# Patient Record
Sex: Female | Born: 1965 | Race: White | Hispanic: No | Marital: Married | Smoking: Never smoker
Health system: Southern US, Community
[De-identification: ages and names within clinical notes are randomized; demographics above are authoritative.]

## PROBLEM LIST (undated history)

## (undated) DIAGNOSIS — K5732 Diverticulitis of large intestine without perforation or abscess without bleeding: Secondary | ICD-10-CM

## (undated) DIAGNOSIS — E119 Type 2 diabetes mellitus without complications: Secondary | ICD-10-CM

## (undated) DIAGNOSIS — F419 Anxiety disorder, unspecified: Secondary | ICD-10-CM

## (undated) DIAGNOSIS — K219 Gastro-esophageal reflux disease without esophagitis: Secondary | ICD-10-CM

## (undated) DIAGNOSIS — K760 Fatty (change of) liver, not elsewhere classified: Principal | ICD-10-CM

## (undated) DIAGNOSIS — M1990 Unspecified osteoarthritis, unspecified site: Secondary | ICD-10-CM

## (undated) DIAGNOSIS — IMO0001 Reserved for inherently not codable concepts without codable children: Secondary | ICD-10-CM

## (undated) DIAGNOSIS — R011 Cardiac murmur, unspecified: Secondary | ICD-10-CM

## (undated) DIAGNOSIS — R600 Localized edema: Secondary | ICD-10-CM

## (undated) HISTORY — DX: Cardiac murmur, unspecified: R01.1

## (undated) HISTORY — DX: Fatty (change of) liver, not elsewhere classified: K76.0

## (undated) HISTORY — DX: Localized edema: R60.0

## (undated) HISTORY — DX: Morbid (severe) obesity due to excess calories: E66.01

## (undated) HISTORY — DX: Type 2 diabetes mellitus without complications: E11.9

## (undated) HISTORY — DX: Reserved for inherently not codable concepts without codable children: IMO0001

## (undated) HISTORY — DX: Anxiety disorder, unspecified: F41.9

## (undated) HISTORY — DX: Diverticulitis of large intestine without perforation or abscess without bleeding: K57.32

---

## 1991-09-27 HISTORY — PX: TONSILLECTOMY: SUR1361

## 1993-06-27 HISTORY — PX: BARTHOLIN GLAND CYST EXCISION: SHX565

## 2001-05-15 HISTORY — PX: LYSIS OF ADHESION: SHX5961

## 2001-05-15 HISTORY — PX: LAPAROSCOPIC OOPHERECTOMY: SHX6507

## 2001-05-15 HISTORY — PX: SALPINGECTOMY: SHX328

## 2001-12-18 HISTORY — PX: ABDOMINAL HYSTERECTOMY: SHX81

## 2004-01-01 HISTORY — PX: CHOLECYSTECTOMY: SHX55

## 2004-06-10 ENCOUNTER — Ambulatory Visit: Payer: Self-pay | Admitting: Obstetrics and Gynecology

## 2005-08-17 ENCOUNTER — Ambulatory Visit: Payer: Self-pay | Admitting: Obstetrics and Gynecology

## 2006-03-02 ENCOUNTER — Ambulatory Visit: Payer: Self-pay | Admitting: Internal Medicine

## 2006-03-22 ENCOUNTER — Ambulatory Visit: Payer: Self-pay | Admitting: Unknown Physician Specialty

## 2006-05-05 ENCOUNTER — Ambulatory Visit: Payer: Self-pay | Admitting: Otolaryngology

## 2006-07-27 ENCOUNTER — Ambulatory Visit: Payer: Self-pay | Admitting: Otolaryngology

## 2006-08-17 ENCOUNTER — Ambulatory Visit: Payer: Self-pay | Admitting: Otolaryngology

## 2006-08-17 HISTORY — PX: SEPTOSTOMY: SHX2394

## 2006-09-06 ENCOUNTER — Ambulatory Visit: Payer: Self-pay | Admitting: Internal Medicine

## 2006-11-14 ENCOUNTER — Ambulatory Visit: Payer: Self-pay | Admitting: Obstetrics and Gynecology

## 2006-12-21 ENCOUNTER — Ambulatory Visit: Payer: Self-pay | Admitting: Internal Medicine

## 2007-08-22 ENCOUNTER — Ambulatory Visit: Payer: Self-pay | Admitting: Obstetrics and Gynecology

## 2007-10-18 ENCOUNTER — Encounter: Admission: RE | Admit: 2007-10-18 | Discharge: 2007-10-18 | Payer: Self-pay | Admitting: Gastroenterology

## 2007-11-16 ENCOUNTER — Ambulatory Visit: Payer: Self-pay | Admitting: Obstetrics and Gynecology

## 2008-11-18 ENCOUNTER — Ambulatory Visit: Payer: Self-pay | Admitting: Internal Medicine

## 2010-03-03 ENCOUNTER — Ambulatory Visit: Payer: Self-pay | Admitting: Internal Medicine

## 2010-07-18 ENCOUNTER — Encounter: Payer: Self-pay | Admitting: Gastroenterology

## 2011-05-13 ENCOUNTER — Telehealth: Payer: Self-pay | Admitting: Internal Medicine

## 2011-05-13 DIAGNOSIS — Z1239 Encounter for other screening for malignant neoplasm of breast: Secondary | ICD-10-CM

## 2011-05-13 NOTE — Telephone Encounter (Signed)
Order was given to [place].  She will contact patient.

## 2011-05-13 NOTE — Telephone Encounter (Signed)
Patient called and stated it was time for her mammogram to be ordered.  She is also getting [place] to fax over her last one.

## 2011-05-13 NOTE — Telephone Encounter (Signed)
Order made , on printer.

## 2011-06-15 ENCOUNTER — Ambulatory Visit: Payer: Self-pay | Admitting: Internal Medicine

## 2012-06-05 ENCOUNTER — Ambulatory Visit: Payer: Self-pay | Admitting: Orthopedic Surgery

## 2012-06-25 ENCOUNTER — Ambulatory Visit: Payer: Self-pay | Admitting: Orthopedic Surgery

## 2012-06-25 LAB — CBC
HCT: 39.9 % (ref 35.0–47.0)
MCV: 83 fL (ref 80–100)
RBC: 4.8 10*6/uL (ref 3.80–5.20)
RDW: 13.1 % (ref 11.5–14.5)
WBC: 10.2 10*3/uL (ref 3.6–11.0)

## 2012-06-25 LAB — BASIC METABOLIC PANEL
Anion Gap: 6 — ABNORMAL LOW (ref 7–16)
Calcium, Total: 9.4 mg/dL (ref 8.5–10.1)
Co2: 30 mmol/L (ref 21–32)
EGFR (Non-African Amer.): >60
Osmolality: 285 (ref 275–301)

## 2012-06-25 LAB — PROTIME-INR: Prothrombin Time: 11.9 secs (ref 11.5–14.7)

## 2012-06-29 ENCOUNTER — Ambulatory Visit: Payer: Self-pay | Admitting: Orthopedic Surgery

## 2012-07-10 HISTORY — PX: ARTHROSCOPIC REPAIR ACL: SUR80

## 2012-08-28 ENCOUNTER — Ambulatory Visit: Payer: Self-pay | Admitting: Family Medicine

## 2012-09-24 ENCOUNTER — Ambulatory Visit: Payer: Self-pay | Admitting: Family Medicine

## 2012-10-01 ENCOUNTER — Ambulatory Visit: Payer: Self-pay | Admitting: Family Medicine

## 2012-10-25 ENCOUNTER — Ambulatory Visit: Payer: Self-pay | Admitting: Family Medicine

## 2013-05-02 ENCOUNTER — Emergency Department: Payer: Self-pay | Admitting: Emergency Medicine

## 2013-05-02 LAB — CBC
MCH: 27.9 pg (ref 26.0–34.0)
MCHC: 34.6 g/dL (ref 32.0–36.0)
RBC: 4.89 10*6/uL (ref 3.80–5.20)
RDW: 13 % (ref 11.5–14.5)
WBC: 10.4 10*3/uL (ref 3.6–11.0)

## 2013-05-02 LAB — URINALYSIS, COMPLETE
Leukocyte Esterase: NEGATIVE
Protein: NEGATIVE
Specific Gravity: 1.017 (ref 1.003–1.030)
Squamous Epithelial: 3

## 2013-05-02 LAB — COMPREHENSIVE METABOLIC PANEL
Albumin: 3.5 g/dL (ref 3.4–5.0)
Anion Gap: 3 — ABNORMAL LOW (ref 7–16)
Bilirubin,Total: 0.4 mg/dL (ref 0.2–1.0)
Calcium, Total: 9.5 mg/dL (ref 8.5–10.1)
Chloride: 103 mmol/L (ref 98–107)
Co2: 31 mmol/L (ref 21–32)
Creatinine: 0.65 mg/dL (ref 0.60–1.30)
EGFR (African American): >60
Glucose: 182 mg/dL — ABNORMAL HIGH (ref 65–99)
Potassium: 4.2 mmol/L (ref 3.5–5.1)
SGOT(AST): 28 U/L (ref 15–37)
SGPT (ALT): 26 U/L (ref 12–78)
Sodium: 137 mmol/L (ref 136–145)
Total Protein: 7.4 g/dL (ref 6.4–8.2)

## 2013-05-02 LAB — LIPASE, BLOOD: Lipase: 110 U/L (ref 73–393)

## 2014-07-28 ENCOUNTER — Emergency Department: Payer: Self-pay | Admitting: Emergency Medicine

## 2014-07-28 LAB — CBC WITH DIFFERENTIAL/PLATELET
BASOS ABS: 0 10*3/uL (ref 0.0–0.1)
Basophil %: 0.4 %
EOS ABS: 0.1 10*3/uL (ref 0.0–0.7)
Eosinophil %: 1 %
HCT: 45.8 % (ref 35.0–47.0)
HGB: 15.1 g/dL (ref 12.0–16.0)
LYMPHS PCT: 22.5 %
Lymphocyte #: 1.6 10*3/uL (ref 1.0–3.6)
MCH: 26.6 pg (ref 26.0–34.0)
MCHC: 33.1 g/dL (ref 32.0–36.0)
MCV: 80 fL (ref 80–100)
Monocyte #: 0.4 x10 3/mm (ref 0.2–0.9)
Monocyte %: 5.8 %
Neutrophil #: 4.9 10*3/uL (ref 1.4–6.5)
Neutrophil %: 70.3 %
PLATELETS: 263 10*3/uL (ref 150–440)
RBC: 5.7 10*6/uL — ABNORMAL HIGH (ref 3.80–5.20)
RDW: 12.9 % (ref 11.5–14.5)
WBC: 7 10*3/uL (ref 3.6–11.0)

## 2014-07-28 LAB — COMPREHENSIVE METABOLIC PANEL
ALBUMIN: 3.3 g/dL — AB (ref 3.4–5.0)
ALT: 76 U/L — AB (ref 14–63)
AST: 67 U/L — AB (ref 15–37)
Alkaline Phosphatase: 104 U/L (ref 46–116)
Anion Gap: 10 (ref 7–16)
BUN: 11 mg/dL (ref 7–18)
Bilirubin,Total: 0.4 mg/dL (ref 0.2–1.0)
CALCIUM: 9.3 mg/dL (ref 8.5–10.1)
Chloride: 102 mmol/L (ref 98–107)
Co2: 24 mmol/L (ref 21–32)
Creatinine: 0.79 mg/dL (ref 0.60–1.30)
EGFR (African American): >60
EGFR (Non-African Amer.): >60
Glucose: 205 mg/dL — ABNORMAL HIGH (ref 65–99)
Osmolality: 277 (ref 275–301)
POTASSIUM: 3 mmol/L — AB (ref 3.5–5.1)
Sodium: 136 mmol/L (ref 136–145)
TOTAL PROTEIN: 7.5 g/dL (ref 6.4–8.2)

## 2014-07-28 LAB — URINALYSIS, COMPLETE
BACTERIA: NONE SEEN
BLOOD: NEGATIVE
Bilirubin,UR: NEGATIVE
GLUCOSE, UR: NEGATIVE mg/dL (ref 0–75)
Hyaline Cast: 4
Nitrite: NEGATIVE
Ph: 5 (ref 4.5–8.0)
Protein: 30
RBC,UR: 1 /HPF (ref 0–5)
SPECIFIC GRAVITY: 1.02 (ref 1.003–1.030)

## 2014-07-28 LAB — CLOSTRIDIUM DIFFICILE(ARMC)

## 2014-10-08 ENCOUNTER — Ambulatory Visit
Admit: 2014-10-08 | Disposition: A | Discharge status: Home / Self Care | Payer: Self-pay | Attending: Family Medicine | Admitting: Family Medicine

## 2015-01-01 ENCOUNTER — Other Ambulatory Visit: Payer: Self-pay | Admitting: Family Medicine

## 2015-01-01 DIAGNOSIS — I872 Venous insufficiency (chronic) (peripheral): Secondary | ICD-10-CM

## 2015-01-02 ENCOUNTER — Other Ambulatory Visit: Payer: Self-pay

## 2015-01-02 DIAGNOSIS — E1165 Type 2 diabetes mellitus with hyperglycemia: Secondary | ICD-10-CM

## 2015-01-02 DIAGNOSIS — IMO0002 Reserved for concepts with insufficient information to code with codable children: Secondary | ICD-10-CM

## 2015-01-02 NOTE — Telephone Encounter (Signed)
Received a fax from [facility] requesting a refill of this patient's Invokana.  Checked old EMR and the note states:  Invokana 300MG , 1 (one) Tablet daily, #30, 30 days starting 08/26/2014, Ref. x5. Active. Associated Diagnosis: Uncontrolled type II diabetes mellitus (250.02  E11.65) Recorded 08/26/2014 08:32 AM by Bobetta Lime, MD, Office Visit. ePrescribed to [phone], 08/26/2014 8:32 AM [facility] [address] [place], [place], [place] [address] [phone]

## 2015-01-05 MED ORDER — CANAGLIFLOZIN 300 MG PO TABS: 300.0000 mg | ORAL_TABLET | Freq: Every day | ORAL | Status: DC

## 2015-01-26 ENCOUNTER — Telehealth: Payer: Self-pay | Admitting: Family Medicine

## 2015-01-26 NOTE — Telephone Encounter (Signed)
PT ASKING FOR LAB SHEET FOR LABS. HAS TO HAVE THIS WITH IN 2 WKS BEFORE SHE CAN GET ANY RX REFILLED.

## 2015-01-27 NOTE — Telephone Encounter (Signed)
Tried to contact patient to inform her that she needs to follow-up in office for her diabetes, but there was no answer. A message was left for the patient to give [place] a call so we can get her an appt.

## 2015-01-27 NOTE — Telephone Encounter (Signed)
Full panel blood work done 07/2014 she needs to follow up in office for diabetes and I will check her glucose and Hba1c in the office.

## 2015-03-17 ENCOUNTER — Ambulatory Visit (INDEPENDENT_AMBULATORY_CARE_PROVIDER_SITE_OTHER): Payer: 59 | Admitting: Family Medicine

## 2015-03-17 ENCOUNTER — Encounter: Payer: Self-pay | Admitting: Family Medicine

## 2015-03-17 VITALS — BP 136/72 | HR 90 | Temp 98.3°F | Resp 16 | Ht 67.0 in | Wt 255.3 lb

## 2015-03-17 DIAGNOSIS — E669 Obesity, unspecified: Secondary | ICD-10-CM | POA: Diagnosis not present

## 2015-03-17 DIAGNOSIS — R600 Localized edema: Secondary | ICD-10-CM | POA: Diagnosis not present

## 2015-03-17 DIAGNOSIS — IMO0002 Reserved for concepts with insufficient information to code with codable children: Secondary | ICD-10-CM

## 2015-03-17 DIAGNOSIS — E1165 Type 2 diabetes mellitus with hyperglycemia: Secondary | ICD-10-CM

## 2015-03-17 DIAGNOSIS — F411 Generalized anxiety disorder: Secondary | ICD-10-CM | POA: Diagnosis not present

## 2015-03-17 DIAGNOSIS — Z8719 Personal history of other diseases of the digestive system: Secondary | ICD-10-CM | POA: Diagnosis not present

## 2015-03-17 DIAGNOSIS — R1031 Right lower quadrant pain: Secondary | ICD-10-CM

## 2015-03-17 HISTORY — DX: Morbid (severe) obesity due to excess calories: E66.01

## 2015-03-17 LAB — POCT URINALYSIS DIPSTICK
Bilirubin, UA: NEGATIVE
Blood, UA: NEGATIVE
GLUCOSE UA: NEGATIVE
Ketones, UA: NEGATIVE
NITRITE UA: NEGATIVE
Protein, UA: NEGATIVE
Spec Grav, UA: >=1.03
UROBILINOGEN UA: 0.2
pH, UA: 5

## 2015-03-17 MED ORDER — ALPRAZOLAM 0.5 MG PO TABS: 0.5000 mg | ORAL_TABLET | Freq: Every evening | ORAL | Status: DC | PRN

## 2015-03-17 MED ORDER — HYDROCODONE-ACETAMINOPHEN 5-300 MG PO TABS: 1.0000 | ORAL_TABLET | Freq: Three times a day (TID) | ORAL | Status: DC | PRN

## 2015-03-17 NOTE — Progress Notes (Signed)
Name: Jessica Tran   MRN: [id number]    DOB: 11/16/1965   Date:03/17/2015       Progress Note  Subjective  Chief Complaint  Chief Complaint  Patient presents with  . Nephrolithiasis    patient stated that she started having some right lower quadrant pain that started on wednesday and now it is radiating to her lower back/ gluteal area.    HPI  Jessica Tran is a 49 year old female with a history of uncontrolled DMII, Obesity, history of diverticular inflammation x2 in the past year here today with acute concerns of RLQ abdominal pain. Onset four days ago on Friday suddenly. Described as alternating sharp and cramping pain in the RLQ with more recent radiation to right hip and right lower flank which is worse with movement. Denies nausea, vomiting, diarrhea, constipation, fevers, chills, change in urinary color and stream.  She works at [employer] and so a Jessica Tran there put her on Ciprofloxacin 500 mg po bid along with Metronidazole 500 mg po qid. She reports slowly improving symptoms since then but not resolved. She has a high deductible insurance plan so does not want expensive testing done if possible. She has not had a kidney stone in the past but did read that her Invokana could cause this so she stopped taking it about 3 weeks ago. Also the Invokana has made her feel funny symptoms in her chest. She notes that her sugars are still well controled, fasting ranges 90-120 and meal time is around 150s.   Active Ambulatory Problems    Diagnosis Date Noted  . History of diverticulitis 03/17/2015  . Diabetes mellitus type II, uncontrolled 03/17/2015  . Bilateral lower extremity edema 03/17/2015  . Generalized anxiety disorder 03/17/2015  . Obesity, Class II, BMI 35-39.9 03/17/2015  . Acute right lower quadrant pain 03/17/2015   Resolved Ambulatory Problems    Diagnosis Date Noted  . No Resolved Ambulatory Problems   Past Medical History  Diagnosis Date  . Anxiety   . Diabetes  mellitus without complication   . Obesity, Class II, BMI 35.0-39.9, with comorbidity (see actual BMI)   . Cardiac murmur   . Diverticulitis of colon     Social History  Substance Use Topics  . Smoking status: Never Smoker   . Smokeless tobacco: Not on file  . Alcohol Use: 0.0 oz/week    0 Standard drinks or equivalent per week     Comment: occasionally     Current outpatient prescriptions:  .  ciprofloxacin (CIPRO) 500 MG tablet, Take 500 mg by mouth 2 (two) times daily., Disp: , Rfl:  .  glipiZIDE (GLUCOTROL) 10 MG tablet, Take 10 mg by mouth 2 (two) times daily before a meal., Disp: , Rfl:  .  metroNIDAZOLE (FLAGYL) 500 MG tablet, Take 500 mg by mouth 4 (four) times daily., Disp: , Rfl:  .  ALPRAZolam (XANAX) 0.5 MG tablet, Take 1 tablet (0.5 mg total) by mouth at bedtime as needed for anxiety., Disp: 30 tablet, Rfl: 5 .  Hydrocodone-Acetaminophen 5-300 MG TABS, Take 1 tablet by mouth every 8 (eight) hours as needed., Disp: 30 each, Rfl: 0 PRESCRIBED TODAY 03/17/15  Past Surgical History  Procedure Laterality Date  . Laparoscopic oopherectomy Left 05/15/2001  . Cholecystectomy  01/01/2004    LAPROSCOPIC  . Abdominal hysterectomy  12/18/2001    LAPROSCOPIC SUPRACERVICAL WITH LYSES OF ADHESIONS  . Septostomy  08/17/2006  . Arthroscopic repair acl Right 07/10/2012    MCL TEAR 1995  .  Bartholin gland cyst excision  1995  . Salpingectomy Left 05/15/2001    LAPROSCOPIC  . Lysis of adhesion  05/15/2001    LAPARSCOPIC  . Tonsillectomy  09/27/1991    Family History  Problem Relation Age of Onset  . Hyperlipidemia Mother   . Hypertension Mother   . Diabetes Mother   . Hyperlipidemia Father   . Hypertension Father     Allergies  Allergen Reactions  . Clindamycin/Lincomycin Swelling    Throat swelling     Review of Systems  CONSTITUTIONAL: No significant weight changes, fever, chills, weakness or fatigue.   CARDIOVASCULAR: No chest pain, chest pressure or chest discomfort.  No palpitations or edema.  RESPIRATORY: No shortness of breath, cough or sputum.  GASTROINTESTINAL: No anorexia, nausea, vomiting. No changes in bowel habits. Yes RLQ abdominal pain. GENITOURINARY: No dysuria. No frequency. No discharge.  NEUROLOGICAL: No headache, dizziness, syncope, paralysis, ataxia, numbness or tingling in the extremities. No memory changes. No change in bowel or bladder control.  MUSCULOSKELETAL: No joint pain. No muscle pain. HEMATOLOGIC: No anemia, bleeding or bruising.  LYMPHATICS: No enlarged lymph nodes.  PSYCHIATRIC: No change in mood. No change in sleep pattern.  ENDOCRINOLOGIC: No reports of sweating, cold or heat intolerance. No polyuria or polydipsia.     Objective  BP 136/72 mmHg  Pulse 90  Temp(Src) 98.3 F (36.8 C) (Oral)  Resp 16  Ht 5\' 7"  (1.702 m)  Wt 255 lb 4.8 oz (115.803 kg)  BMI 39.98 kg/m2  SpO2 96%  LMP  Body mass index is 39.98 kg/(m^2).  Physical Exam  Constitutional: Patient is obese and well-nourished. In no distress.  Cardiovascular: Normal rate, regular rhythm and normal heart sounds.  No murmur heard.  Pulmonary/Chest: Effort normal and breath sounds normal. No respiratory distress. Musculoskeletal: Normal range of motion bilateral UE and LE, no joint effusions. Abdomen: Soft, obese, bowel sounds normal in all four quadrants, no HSM, RLQ pain with deep palpation only, no guarding or rebound. No CVA tenderness reproduced. Peripheral vascular: Bilateral LE trace edema. Neurological: CN II-XII grossly intact with no focal deficits. Alert and oriented to person, place, and time. Coordination, balance, strength, speech and gait are normal.  Skin: Skin is warm and dry. No rash noted. No erythema.  Psychiatric: Patient has a normal mood and affect. Behavior is normal in office today. Judgment and thought content normal in office today.   Recent Results (from the past 2160 hour(s))  POCT urinalysis dipstick     Status: Abnormal    Collection Time: 03/17/15  9:46 AM  Result Value Ref Range   Color, UA yellow    Clarity, UA pale    Glucose, UA negative    Bilirubin, UA negative    Ketones, UA negative    Spec Grav, UA >=1.030    Blood, UA negative    pH, UA 5.0    Protein, UA negative    Urobilinogen, UA 0.2    Nitrite, UA negative    Leukocytes, UA Trace (A) Negative     Assessment & Plan  1. Diabetes mellitus type II, uncontrolled She is to continue Glucotrol 10 mg po bid for now. I have counseled patient that sulfonylurea is not first line single agent choice for optimal diabetes control. She was intolerant to Metformin due to GI side effects. She self discontinued Invokana.   Patient's Hba1c goal is <7% is acceptable  Encouraged patient to continue efforts on checking blood glucose on a daily basis. Fasting blood glucose  control goal is 80-130mg /dL and post prandial blood glucose control is 180mg /dL.  Reviewed diet, exercise, lifestyle changes and current medication regimen pertaining to diabetes with the patient.   Reminded patient of the required annual dilated retinal exam.   - CBC with Differential/Platelet - Comprehensive metabolic panel - Hemoglobin A1c  2. History of diverticulitis Current symptoms may be due to recurrent infection. Already on Cipro and Flagyl.  3. Bilateral lower extremity edema Stable.   4. Obesity, Class II, BMI 35-39.9 The patient has been counseled on their higher than normal BMI.  They have verbally expressed understanding their increased risk for other diseases.  In efforts to meet a better target BMI goal the patient has been counseled on lifestyle, diet and exercise modification tactics. Start with moderate intensity aerobic exercise (walking, jogging, elliptical, swimming, group or individual sports, hiking) at least 62mins a day at least 4 days a week and increase intensity, duration, frequency as tolerated. Diet should include well balance fresh fruits and  vegetables avoiding processed foods, carbohydrates and sugars. Drink at least 8oz 10 glasses a day avoiding sodas, sugary fruit drinks, sweetened tea. Check weight on a reliable scale daily and monitor weight loss progress daily. Consider investing in mobile phone apps that will help keep track of weight loss goals.   5. Acute right lower quadrant pain Differential diagnosis discussed with patient include recurrent diverticulitis, cystitis, appendicitis, kidney stone, ovarian cyst. Plan is to get KUB and blood work. Continue antibiotics she is already on (Flagyl and Cipro). Pain control with Hydrocodone-Aceto.  If symptoms persist/worsen encouraged patient to proceed to ER. She declined CT scan testing today.  - POCT urinalysis dipstick - Urine Culture - CBC with Differential/Platelet - Comprehensive metabolic panel - Hydrocodone-Acetaminophen 5-300 MG TABS; Take 1 tablet by mouth every 8 (eight) hours as needed.  Dispense: 30 each; Refill: 0 - DG Abd 1 View; Future  6. Generalized anxiety disorder Stable. Refilled prn use of benzodiazepine.   - ALPRAZolam (XANAX) 0.5 MG tablet; Take 1 tablet (0.5 mg total) by mouth at bedtime as needed for anxiety.  Dispense: 30 tablet; Refill: 5

## 2015-03-19 LAB — URINE CULTURE: Organism ID, Bacteria: NO GROWTH

## 2015-03-23 ENCOUNTER — Telehealth: Payer: Self-pay

## 2015-03-23 NOTE — Telephone Encounter (Signed)
I contacted this patient to inform her that I spoke with Angie Ward with [facility] customer service and she told me that there was no results in their system, so I walked over to our onsite [facility] since she stated that she had them (3 vials) drawn there by Judeen Hammans and spoke with them. They looked in their system but could only see that she signed in around 10am and checked out about 10:20am, but they could not see where any vials where drawn.   I then left her a message stating that she could have her labs re-drawn since the order was still in the system or she could give them a call to find out what happened to her vials of blood.

## 2015-03-23 NOTE — Telephone Encounter (Signed)
Patient called wanting to know about her lab results. She stated that she went the very next day and has not heard anything. I informed her that we have not received any results but that I will call [facility] to find out if they have finalized and then I will give her a call back.   She asked if we would call [phone].

## 2015-03-24 ENCOUNTER — Telehealth: Payer: Self-pay

## 2015-03-24 NOTE — Telephone Encounter (Signed)
This patient returned my call and the message below was reviewed.   I contacted this patient to inform her that I spoke with Angie Ward with [facility] customer service and she told me that there was no results in their system, so I walked over to our onsite [facility] since she stated that she had them (3 vials) drawn there by Judeen Hammans and spoke with them. They looked in their system but could only see that she signed in around 10am and checked out about 10:20am, but they could not see where any vials where drawn.   I then left her a message stating that she could have her labs re-drawn since the order was still in the system or she could give them a call to find out what happened to her vials of blood.          She stated that she will give them a call.

## 2015-03-27 ENCOUNTER — Telehealth: Payer: Self-pay

## 2015-03-27 ENCOUNTER — Other Ambulatory Visit: Payer: Self-pay | Admitting: Family Medicine

## 2015-03-27 NOTE — Telephone Encounter (Signed)
Patient was informed of Dr. Allie Dimmer message and a copy of her labs was faxed to her office at [phone].

## 2015-03-27 NOTE — Telephone Encounter (Signed)
A message was left for this patient to give [place] a call back, so Dr. Allie Dimmer message could be reviewed.

## 2015-03-27 NOTE — Telephone Encounter (Signed)
Patient returned my call so that I could review her results with her and she was in shock that her blood sugar was not lower. I told her that Dr. Nadine Counts wanted to follow up with her regarding this, but she stated there are several people out of work and that she could not afford it at this time b/c every visit here is $150 each. She stated that she was taking the invonkana but it was giving her a funny feeling in her chest so she stopped taking it, but she thinks she has refills on it if Dr. Nadine Counts wanted her to start again.  She is not sure of what else she can do and is need of guidance, but cannot afford another office visit at this time.

## 2015-03-27 NOTE — Telephone Encounter (Signed)
Requesting a refill on Invokana. Please send to [facility]

## 2015-03-27 NOTE — Telephone Encounter (Signed)
Ideally she should start up the [place] again and change her lifestyle for aggressive control with low fat, low carb, NO sugar diet with daily exercise. Other new medications may not be cost effective with her insurance plan. Insulin is cheap but she has to be willing to start this.

## 2015-03-27 NOTE — Telephone Encounter (Signed)
Refill request was sent to Dr. Bobetta Lime for approval and submission.  Invokana 300MG , 1 (one) Tablet daily, #30, 30 days starting 08/26/2014, Ref. x5. Active. Associated Diagnosis:Uncontrolled type II diabetes mellitus (250.02  E11.65)  Recorded 08/26/2014 08:32 AM by Bobetta Lime, MD, Office Visit. ePrescribed to [phone], 08/26/2014 8:32 AM [facility] [address], [place], [place], [place] [address] [phone]

## 2015-03-30 MED ORDER — CANAGLIFLOZIN 300 MG PO TABS: 300.0000 mg | ORAL_TABLET | Freq: Every day | ORAL | Status: DC

## 2015-04-10 LAB — COMPREHENSIVE METABOLIC PANEL
ALBUMIN: 4.1 g/dL (ref 3.5–5.5)
ALK PHOS: 85 IU/L (ref 39–117)
ALT: 19 IU/L (ref 0–32)
AST: 15 IU/L (ref 0–40)
Albumin/Globulin Ratio: 1.5 (ref 1.1–2.5)
BILIRUBIN TOTAL: 0.3 mg/dL (ref 0.0–1.2)
BUN / CREAT RATIO: 16 (ref 9–23)
BUN: 10 mg/dL (ref 6–24)
CHLORIDE: 100 mmol/L (ref 97–108)
CO2: 22 mmol/L (ref 18–29)
CREATININE: 0.62 mg/dL (ref 0.57–1.00)
Calcium: 9.1 mg/dL (ref 8.7–10.2)
GFR calc Af Amer: 121 mL/min/{1.73_m2} (ref >59)
GFR calc non Af Amer: 105 mL/min/{1.73_m2} (ref >59)
GLOBULIN, TOTAL: 2.7 g/dL (ref 1.5–4.5)
GLUCOSE: 176 mg/dL — AB (ref 65–99)
Potassium: 5.1 mmol/L (ref 3.5–5.2)
SODIUM: 138 mmol/L (ref 134–144)
Total Protein: 6.8 g/dL (ref 6.0–8.5)

## 2015-04-10 LAB — HEMOGLOBIN A1C
ESTIMATED AVERAGE GLUCOSE: 189 mg/dL
HEMOGLOBIN A1C: 8.2 % — AB (ref 4.8–5.6)

## 2015-04-10 LAB — CBC WITH DIFFERENTIAL/PLATELET
BASOS ABS: 0 10*3/uL (ref 0.0–0.2)
Basos: 0 %
EOS (ABSOLUTE): 0.2 10*3/uL (ref 0.0–0.4)
Eos: 2 %
HEMOGLOBIN: 13.7 g/dL (ref 11.1–15.9)
Hematocrit: 39.8 % (ref 34.0–46.6)
Immature Grans (Abs): 0 10*3/uL (ref 0.0–0.1)
Immature Granulocytes: 0 %
LYMPHS ABS: 2.5 10*3/uL (ref 0.7–3.1)
Lymphs: 32 %
MCH: 27.8 pg (ref 26.6–33.0)
MCHC: 34.4 g/dL (ref 31.5–35.7)
MCV: 81 fL (ref 79–97)
MONOS ABS: 0.3 10*3/uL (ref 0.1–0.9)
Monocytes: 4 %
NEUTROS ABS: 4.9 10*3/uL (ref 1.4–7.0)
Neutrophils: 62 %
Platelets: 304 10*3/uL (ref 150–379)
RBC: 4.93 x10E6/uL (ref 3.77–5.28)
RDW: 12.8 % (ref 12.3–15.4)
WBC: 7.9 10*3/uL (ref 3.4–10.8)

## 2015-04-13 ENCOUNTER — Other Ambulatory Visit: Payer: Self-pay | Admitting: Family Medicine

## 2015-08-01 ENCOUNTER — Other Ambulatory Visit: Payer: Self-pay | Admitting: Family Medicine

## 2015-09-01 ENCOUNTER — Other Ambulatory Visit: Payer: Self-pay | Admitting: Family Medicine

## 2015-09-21 ENCOUNTER — Other Ambulatory Visit: Payer: Self-pay

## 2015-09-21 MED ORDER — GLIPIZIDE ER 10 MG PO TB24: 20.0000 mg | ORAL_TABLET | Freq: Every day | ORAL | Status: DC

## 2015-09-28 ENCOUNTER — Other Ambulatory Visit: Payer: Self-pay

## 2015-09-28 NOTE — Telephone Encounter (Signed)
Rx denied; needs appt to discuss

## 2015-10-05 ENCOUNTER — Telehealth: Payer: Self-pay | Admitting: Family Medicine

## 2015-10-05 DIAGNOSIS — Z1239 Encounter for other screening for malignant neoplasm of breast: Secondary | ICD-10-CM

## 2015-10-05 NOTE — Telephone Encounter (Signed)
Patient normally see Dr Nadine Counts. She is requesting a order for annual mammogram (preventative one). The last one she had was 10-08-14

## 2015-10-05 NOTE — Telephone Encounter (Signed)
mammo order entered.

## 2015-10-08 ENCOUNTER — Other Ambulatory Visit: Payer: Self-pay

## 2015-10-08 NOTE — Telephone Encounter (Signed)
Rx denied; patient needs an appt

## 2015-10-27 ENCOUNTER — Ambulatory Visit
Admission: RE | Admit: 2015-10-27 | Discharge: 2015-10-27 | Disposition: A | Discharge status: Home / Self Care | Payer: 59 | Source: Ambulatory Visit | Attending: Family Medicine | Admitting: Family Medicine

## 2015-10-27 DIAGNOSIS — Z1231 Encounter for screening mammogram for malignant neoplasm of breast: Secondary | ICD-10-CM | POA: Diagnosis present

## 2015-10-29 ENCOUNTER — Other Ambulatory Visit: Payer: Self-pay

## 2015-10-29 NOTE — Telephone Encounter (Signed)
I don't see any hx of CHF Denied lasix request Needs an appt

## 2015-11-04 ENCOUNTER — Ambulatory Visit: Payer: 59 | Admitting: Family Medicine

## 2015-11-18 ENCOUNTER — Ambulatory Visit: Payer: 59 | Admitting: Family Medicine

## 2015-12-04 ENCOUNTER — Other Ambulatory Visit: Payer: Self-pay | Admitting: Family Medicine

## 2015-12-04 NOTE — Telephone Encounter (Signed)
LVM to inform pt.

## 2015-12-04 NOTE — Telephone Encounter (Signed)
Patient requesting refill. 

## 2015-12-04 NOTE — Telephone Encounter (Signed)
Last labs in Sept 2016; last visit Sept 2016; she cancelled two appts with me in May Rx denied ------------------ She needs an appt please

## 2015-12-14 ENCOUNTER — Ambulatory Visit (INDEPENDENT_AMBULATORY_CARE_PROVIDER_SITE_OTHER): Payer: 59 | Admitting: Family Medicine

## 2015-12-14 ENCOUNTER — Encounter: Payer: Self-pay | Admitting: Family Medicine

## 2015-12-14 ENCOUNTER — Other Ambulatory Visit: Payer: Self-pay | Admitting: Family Medicine

## 2015-12-14 VITALS — BP 124/82 | HR 99 | Temp 98.2°F | Resp 16 | Wt 258.0 lb

## 2015-12-14 DIAGNOSIS — Z0000 Encounter for general adult medical examination without abnormal findings: Secondary | ICD-10-CM

## 2015-12-14 DIAGNOSIS — E1165 Type 2 diabetes mellitus with hyperglycemia: Secondary | ICD-10-CM | POA: Diagnosis not present

## 2015-12-14 DIAGNOSIS — Z8719 Personal history of other diseases of the digestive system: Secondary | ICD-10-CM | POA: Diagnosis not present

## 2015-12-14 DIAGNOSIS — F411 Generalized anxiety disorder: Secondary | ICD-10-CM

## 2015-12-14 LAB — COMPLETE METABOLIC PANEL WITH GFR
ALT: 37 U/L — ABNORMAL HIGH (ref 6–29)
AST: 36 U/L — AB (ref 10–35)
Albumin: 4 g/dL (ref 3.6–5.1)
Alkaline Phosphatase: 80 U/L (ref 33–130)
BILIRUBIN TOTAL: 0.4 mg/dL (ref 0.2–1.2)
BUN: 12 mg/dL (ref 7–25)
CO2: 23 mmol/L (ref 20–31)
Calcium: 9.2 mg/dL (ref 8.6–10.4)
Chloride: 101 mmol/L (ref 98–110)
Creat: 0.65 mg/dL (ref 0.50–1.05)
GFR, Est African American: >89 mL/min (ref >=60)
GLUCOSE: 132 mg/dL — AB (ref 65–99)
Potassium: 4.6 mmol/L (ref 3.5–5.3)
SODIUM: 139 mmol/L (ref 135–146)
TOTAL PROTEIN: 6.5 g/dL (ref 6.1–8.1)

## 2015-12-14 LAB — LIPID PANEL
CHOL/HDL RATIO: 3.3 ratio (ref <=5.0)
CHOLESTEROL: 193 mg/dL (ref 125–200)
HDL: 58 mg/dL (ref >=46)
LDL Cholesterol: 110 mg/dL (ref <130)
Triglycerides: 126 mg/dL (ref <150)
VLDL: 25 mg/dL (ref <30)

## 2015-12-14 LAB — HEMOGLOBIN A1C
HEMOGLOBIN A1C: 9.2 % — AB (ref <5.7)
Mean Plasma Glucose: 217 mg/dL

## 2015-12-14 MED ORDER — METFORMIN HCL ER 500 MG PO TB24
500.0000 mg | ORAL_TABLET | Freq: Every evening | ORAL | Status: DC
Start: 2015-12-14 — End: 2016-02-11

## 2015-12-14 NOTE — Progress Notes (Signed)
BP 124/82 mmHg  Pulse 99  Temp(Src) 98.2 F (36.8 C) (Oral)  Resp 16  Wt 258 lb (117.028 kg)  SpO2 95%   Subjective:    Patient ID: Jessica Tran, female    DOB: 02/02/1966, 50 y.o.   MRN: [id number]  HPI: Jessica Tran is a 50 y.o. female  Chief Complaint  Patient presents with  . Medication Refill  . Labs Only   Patient is new to me today; her previous provider left the practice  Type 2 diabetes; did not tolerate metformin; started 500 mg and was increased to 1000 mg BID and wasn't able to tolerate that much; she thinks she might have tolerated 500 mg alone; her sugars are in the 130s in the mornings  She has had diabetes for about 4 years ago; paternal aunt had type 1 diabetes; some people on mother's side have type 2 diabetes; has been on prednisone 3 times over last several months; had flu and it turned into acute bronchitis and then sinus infection  She started taking a Melaluca sugar control; green coffee tea pill; lost weight last week, 5 pounds; she is trying to lose weight; trying for a while; she was 259.8 at home last week and 254 this week at home  Had recent diverticulitis case; then got C diff from the antibiotics; got that eating tomatoes  She has xanax on her medicine list; 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 Anxiety   . Diabetes mellitus without complication ([place])   . Obesity, Class II, BMI 35.0-39.9, with comorbidity (see actual BMI) ([place])   . Cardiac murmur   . Bilateral lower extremity edema   . Diverticulitis of colon   . Morbid obesity ([place]) 03/17/2015   Past Surgical History  Procedure Laterality Date  . Laparoscopic oopherectomy Left 05/15/2001  . Cholecystectomy   01/01/2004    LAPROSCOPIC  . Abdominal hysterectomy  12/18/2001    LAPROSCOPIC SUPRACERVICAL WITH LYSES OF ADHESIONS  . Septostomy  08/17/2006  . Arthroscopic repair acl Right 07/10/2012    MCL TEAR 1995  . Bartholin gland cyst excision  1995  . Salpingectomy Left 05/15/2001    LAPROSCOPIC  . Lysis of adhesion  05/15/2001    LAPARSCOPIC  . Tonsillectomy  09/27/1991   Family History  Problem Relation Age of Onset  . Hyperlipidemia Mother   . Hypertension Mother   . Diabetes Mother   . Hyperlipidemia Father   . Hypertension Father    Social History  Substance Use Topics  . Smoking status: Never Smoker   . Smokeless tobacco: None  . Alcohol Use: 0.0 oz/week    0 Standard drinks or equivalent per week     Comment: occasionally   Interim medical history since last visit reviewed. Medications reviewed; allergies to be reviewed by staff  Review of Systems Per HPI unless specifically indicated above     Objective:    BP 124/82 mmHg  Pulse 99  Temp(Src) 98.2 F (36.8 C) (Oral)  Resp 16  Wt 258 lb (117.028 kg)  SpO2 95%  Wt Readings from Last 3 Encounters:  12/14/15 258 lb (117.028 kg)  03/17/15 255 lb 4.8 oz (  115.803 kg)   peak weight 268 pounds body mass index is 40.4 kg/(m^2).   Physical Exam  Constitutional: She appears well-developed and well-nourished. No distress.  HENT:  Head: Normocephalic and atraumatic.  Eyes: EOM are normal. No scleral icterus.  Neck: No thyromegaly present.  Cardiovascular: Normal rate, regular rhythm and normal heart sounds.   No murmur heard. Pulmonary/Chest: Effort normal and breath sounds normal. No respiratory distress. She has no wheezes.  Abdominal: Soft. Bowel sounds are normal. She exhibits no distension.  Musculoskeletal: Normal range of motion. She exhibits no edema.  Neurological: She is alert. She exhibits normal muscle tone.  Skin: Skin is warm and dry. She is not diaphoretic. No pallor.  Psychiatric: She has a normal mood  and affect. Her behavior is normal. Judgment and thought content normal.   Diabetic Foot Form - Detailed   Diabetic Foot Exam - detailed  Diabetic Foot exam was performed with the following findings:  Yes 12/14/2015 11:54 AM  Visual Foot Exam completed.:  Yes  Are the toenails ingrown?:  No  Normal Range of Motion:  Yes    Pulse Foot Exam completed.:  Yes  Right Dorsalis Pedis:  Present Left Dorsalis Pedis:  Present  Sensory Foot Exam Completed.:  Yes  Swelling:  No  Semmes-Weinstein Monofilament Test  R Site 1-Great Toe:  Pos L Site 1-Great Toe:  Pos  R Site 4:  Pos L Site 4:  Pos  R Site 5:  Pos L Site 5:  Pos       Results for orders placed or performed in visit on 03/17/15  Urine Culture  Result Value Ref Range   Urine Culture, Routine Final report    Urine Culture result 1 No growth   CBC with Differential/Platelet  Result Value Ref Range   WBC 7.9 3.4 - 10.8 x10E3/uL   RBC 4.93 3.77 - 5.28 x10E6/uL   Hemoglobin 13.7 11.1 - 15.9 g/dL   Hematocrit 39.8 34.0 - 46.6 %   MCV 81 79 - 97 fL   MCH 27.8 26.6 - 33.0 pg   MCHC 34.4 31.5 - 35.7 g/dL   RDW 12.8 12.3 - 15.4 %   Platelets 304 150 - 379 x10E3/uL   Neutrophils 62 %   Lymphs 32 %   Monocytes 4 %   Eos 2 %   Basos 0 %   Neutrophils Absolute 4.9 1.4 - 7.0 x10E3/uL   Lymphocytes Absolute 2.5 0.7 - 3.1 x10E3/uL   Monocytes Absolute 0.3 0.1 - 0.9 x10E3/uL   EOS (ABSOLUTE) 0.2 0.0 - 0.4 x10E3/uL   Basophils Absolute 0.0 0.0 - 0.2 x10E3/uL   Immature Granulocytes 0 %   Immature Grans (Abs) 0.0 0.0 - 0.1 x10E3/uL  Comprehensive metabolic panel  Result Value Ref Range   Glucose 176 (H) 65 - 99 mg/dL   BUN 10 6 - 24 mg/dL   Creatinine, Ser 0.62 0.57 - 1.00 mg/dL   GFR calc non Af Amer 105 >59 mL/min/1.73   GFR calc Af Amer 121 >59 mL/min/1.73   BUN/Creatinine Ratio 16 9 - 23   Sodium 138 134 - 144 mmol/L   Potassium 5.1 3.5 - 5.2 mmol/L   Chloride 100 97 - 108 mmol/L   CO2 22 18 - 29 mmol/L   Calcium 9.1 8.7 -  10.2 mg/dL   Total Protein 6.8 6.0 - 8.5 g/dL   Albumin 4.1 3.5 - 5.5 g/dL   Globulin, Total 2.7 1.5 - 4.5 g/dL   Albumin/Globulin Ratio  1.5 1.1 - 2.5   Bilirubin Total 0.3 0.0 - 1.2 mg/dL   Alkaline Phosphatase 85 39 - 117 IU/L   AST 15 0 - 40 IU/L   ALT 19 0 - 32 IU/L  Hemoglobin A1c  Result Value Ref Range   Hgb A1c MFr Bld 8.2 (H) 4.8 - 5.6 %   Est. average glucose Bld gHb Est-mCnc 189 mg/dL  POCT urinalysis dipstick  Result Value Ref Range   Color, UA yellow    Clarity, UA pale    Glucose, UA negative    Bilirubin, UA negative    Ketones, UA negative    Spec Grav, UA >=1.030    Blood, UA negative    pH, UA 5.0    Protein, UA negative    Urobilinogen, UA 0.2    Nitrite, UA negative    Leukocytes, UA Trace (A) Negative      Assessment & Plan:   Problem List Items Addressed This Visit      Endocrine   Diabetes mellitus type II, uncontrolled ([place]) - Primary    Start back on just low dose metformin; will check A1c today but realize that the recent prednisone probably will cause that to be high; continue sulfonylurea; check urine microalbumin:Cr urine      Relevant Medications   metFORMIN (GLUCOPHAGE XR) 500 MG 24 hr tablet   Other Relevant Orders   Urine Microalbumin w/creat. ratio   Hemoglobin A1c     Other   Generalized anxiety disorder    I would rather not continue the benzo; no plans for continuing this at present      History of diverticulitis    With recent C diff after antibiotics; avoid seeds      Morbid obesity ([place])    See AVS; encourgement given, slow steady healthy weight loss      Relevant Medications   metFORMIN (GLUCOPHAGE XR) 500 MG 24 hr tablet    Other Visit Diagnoses    Annual physical exam        Relevant Orders    COMPLETE METABOLIC PANEL WITH GFR    Lipid panel       Follow up plan: Return in about 1 month (around 01/13/2016) for complete physical.  An after-visit summary was printed and given to the patient at [facility].   Please see the patient instructions which may contain other information and recommendations beyond what is mentioned above in the assessment and plan.  Meds ordered this encounter  Medications  . metFORMIN (GLUCOPHAGE XR) 500 MG 24 hr tablet    Sig: Take 1 tablet (500 mg total) by mouth every evening.    Dispense:  30 tablet    Refill:  2   Orders Placed This Encounter  Procedures  . Urine Microalbumin w/creat. ratio  . Hemoglobin A1c  . COMPLETE METABOLIC PANEL WITH GFR  . Lipid panel

## 2015-12-14 NOTE — Patient Instructions (Addendum)
Return for a complete physical in the next month or so Have fasting labs done at your convenience soon Please do see your eye doctor regularly, and have your eyes examined every year (or more often per his or her recommendation) Check your feet every night and let me know right away of any sores, infections, numbness, etc. Try to limit sweets, white bread, white rice, white potatoes It is okay with me for you to not check your fingerstick blood sugars (per [employer]ocrinology Best Practices), unless you are interested and feel it would be helpful for you  Check out the information at familydoctor.org entitled "Nutrition for Weight Loss: What You Need to Know about Fad Diets" Try to lose between 1-2 pounds per week by taking in fewer calories and burning off more calories You can succeed by limiting portions, limiting foods dense in calories and fat, becoming more active, and drinking 8 glasses of water a day (64 ounces) Don't skip meals, especially breakfast, as skipping meals may alter your metabolism Do not use over-the-counter weight loss pills or gimmicks that claim rapid weight loss A healthy BMI (or body mass index) is between 18.5 and 24.9 You can calculate your ideal BMI at the Chicken website ClubMonetize.fr   12 Ways to Curb Anxiety  ?Anxiety is normal human sensation. It is what helped our ancestors survive the pitfalls of the wilderness. Anxiety is defined as experiencing worry or nervousness about an imminent event or something with an uncertain outcome. It is a feeling experienced by most people at some point in their lives. Anxiety can be triggered by a very personal issue, such as the illness of a loved one, or an event of global proportions, such as a refugee crisis. Some of the symptoms of anxiety are:  Feeling restless.  Having a feeling of impending danger.  Increased heart rate.  Rapid breathing. Sweating.   Shaking.  Weakness or feeling tired.  Difficulty concentrating on anything except the current worry.  Insomnia.  Stomach or bowel problems. What can we do about anxiety we may be feeling? There are many techniques to help manage stress and relax. Here are 12 ways you can reduce your anxiety almost immediately: 1. Turn off the constant feed of information. Take a social media sabbatical. Studies have shown that social media directly contributes to social anxiety.  2. Monitor your television viewing habits. Are you watching shows that are also contributing to your anxiety, such as 24-hour news stations? Try watching something else, or better yet, nothing at all. Instead, listen to music, read an inspirational book or practice a hobby. 3. Eat nutritious meals. Also, don't skip meals and keep healthful snacks on hand. Hunger and poor diet contributes to feeling anxious. 4. Sleep. Sleeping on a regular schedule for at least seven to eight hours a night will do wonders for your outlook when you are awake. 5. Exercise. Regular exercise will help rid your body of that anxious energy and help you get more restful sleep. 6. Try deep (diaphragmatic) breathing. Inhale slowly through your nose for five seconds and exhale through your mouth. 7. Practice acceptance and gratitude. When anxiety hits, accept that there are things out of your control that shouldn't be of immediate concern.  8. Seek out humor. When anxiety strikes, watch a funny video, read jokes or call a friend who makes you laugh. Laughter is healing for our bodies and releases endorphins that are calming. 9. Stay positive. Take the effort to replace negative  thoughts with positive ones. Try to see a stressful situation in a positive light. Try to come up with solutions rather than dwelling on the problem. 10. Figure out what triggers your anxiety. Keep a journal and make note of anxious moments and the events surrounding them. This will help you  identify triggers you can avoid or even eliminate. 11. Talk to someone. Let a trusted friend, family member or even trained professional know that you are feeling overwhelmed and anxious. Verbalize what you are feeling and why.  12. Volunteer. If your anxiety is triggered by a crisis on a large scale, become an advocate and work to resolve the problem that is causing you unease. Anxiety is often unwelcome and can become overwhelming. If not kept in check, it can become a disorder that could require medical treatment. However, if you take the time to care for yourself and avoid the triggers that make you anxious, you will be able to find moments of relaxation and clarity that make your life much more enjoyable.

## 2015-12-14 NOTE — Assessment & Plan Note (Signed)
With recent C diff after antibiotics; avoid seeds

## 2015-12-14 NOTE — Assessment & Plan Note (Signed)
See AVS; encourgement given, slow steady healthy weight loss

## 2015-12-14 NOTE — Assessment & Plan Note (Signed)
Start back on just low dose metformin; will check A1c today but realize that the recent prednisone probably will cause that to be high; continue sulfonylurea; check urine microalbumin:Cr urine

## 2015-12-15 LAB — MICROALBUMIN / CREATININE URINE RATIO
Creatinine, Urine: 134 mg/dL (ref 20–320)
MICROALB/CREAT RATIO: 4 ug/mg{creat} (ref <30)
Microalb, Ur: 0.5 mg/dL

## 2015-12-20 NOTE — Assessment & Plan Note (Signed)
I would rather not continue the benzo; no plans for continuing this at present

## 2015-12-22 ENCOUNTER — Encounter: Payer: Self-pay | Admitting: Family Medicine

## 2015-12-22 ENCOUNTER — Telehealth: Payer: Self-pay | Admitting: Family Medicine

## 2015-12-22 DIAGNOSIS — K760 Fatty (change of) liver, not elsewhere classified: Secondary | ICD-10-CM

## 2015-12-22 HISTORY — DX: Fatty (change of) liver, not elsewhere classified: K76.0

## 2015-12-22 NOTE — Telephone Encounter (Signed)
I talked with patient about labs A1c too high; she was on prednisone for nearly a month prior to this; she is back on metformin; discussed options; she does not want more meds; wants to lose weight, recheck in 3 months Urine fine LDL too high; I should offer her 40 mg of lipitor; she politely declined, does not want more medicine; wants to recheck in 3 months Mildly elev LFTs; last abd CT showed chronic fatty liver; she was aware; work on weight loss, lowfat diet

## 2016-01-07 ENCOUNTER — Other Ambulatory Visit: Payer: Self-pay | Admitting: Family Medicine

## 2016-01-07 NOTE — Telephone Encounter (Signed)
Patient requesting refill. 

## 2016-02-11 ENCOUNTER — Other Ambulatory Visit: Payer: Self-pay | Admitting: Family Medicine

## 2016-02-13 NOTE — Telephone Encounter (Signed)
Please ask patient to schedule an appointment for her diabetes on or just after Sept 20th; we'll want to recheck labs at that time and see if her sugars are under better control; thank you

## 2016-02-15 NOTE — Telephone Encounter (Signed)
I keep trying to contact patient. Someone will pick up but will not say anything. Will wait until she return call

## 2016-03-25 ENCOUNTER — Telehealth: Payer: Self-pay | Admitting: Family Medicine

## 2016-03-25 NOTE — Telephone Encounter (Signed)
Pt scheduled for 04/25/2016 for DM and she states she is going elsewhere for her CPE

## 2016-03-25 NOTE — Telephone Encounter (Signed)
Patient was supposed to have been seen in July Please call her and ask her to schedule two appointments, first appt needs to be for her diabetes; second appt will be for her physical

## 2016-04-20 ENCOUNTER — Telehealth: Payer: Self-pay | Admitting: Family Medicine

## 2016-04-20 NOTE — Telephone Encounter (Signed)
PT SAID THAT THE NURSE CALED HER TO COME IN ON mONDAY MORNING TO HAVE HER BLOOD WORK DONE AND SHE WANTED TO KNOW IF SHE COULD DO THIS ANOTHER DAY . I NEED HER ORDERS SO THAT WHEN SHE COMES IN SHE WILL BE READY TO HAVE THOSE DONE. I SEEN NOTHING IN HER CHART.

## 2016-04-20 NOTE — Telephone Encounter (Signed)
Left patient voicemail we will do bloodwork at appt I dont see any orders or where Dr. Sanda Klein told her to come for blood work except for an appt?

## 2016-04-25 ENCOUNTER — Ambulatory Visit: Payer: 59 | Admitting: Family Medicine

## 2016-04-27 ENCOUNTER — Other Ambulatory Visit: Payer: Self-pay | Admitting: Family Medicine

## 2016-04-27 DIAGNOSIS — Z6841 Body Mass Index (BMI) 40.0 and over, adult: Secondary | ICD-10-CM | POA: Insufficient documentation

## 2016-04-27 DIAGNOSIS — F411 Generalized anxiety disorder: Secondary | ICD-10-CM | POA: Insufficient documentation

## 2016-04-28 NOTE — Telephone Encounter (Signed)
Patient was due for visit for uncontrolled diabetes around September 19th She has canceled two appointments this week (she had one Monday that she canceled, and one for Friday that she canceled) I really need to see her to help her Please contact her and see if there are other needs (financial, insurance, work schedule, Social research officer, government) Can we suggest [facility] or other program if needed? I'm willing to see her at 7:40 am if that fits her schedule better I simply must insist on seeing her if I'm to prescribe and adjust her medicine and help her with her diabetes I'll allow one more month of medicine but won't be able to continue to prescribe medicine without seeing her to help her Thank you

## 2016-04-29 ENCOUNTER — Ambulatory Visit: Payer: 59 | Admitting: Family Medicine

## 2016-06-23 ENCOUNTER — Encounter: Payer: Self-pay | Admitting: *Deleted

## 2016-06-24 ENCOUNTER — Encounter: Payer: Self-pay | Admitting: Anesthesiology

## 2016-06-24 ENCOUNTER — Ambulatory Visit
Admission: RE | Admit: 2016-06-24 | Discharge: 2016-06-24 | Disposition: A | Discharge status: Home / Self Care | Payer: Commercial Managed Care - HMO | Source: Ambulatory Visit | Attending: Unknown Physician Specialty | Admitting: Unknown Physician Specialty

## 2016-06-24 ENCOUNTER — Ambulatory Visit: Payer: Commercial Managed Care - HMO | Admitting: Anesthesiology

## 2016-06-24 ENCOUNTER — Encounter
Admission: RE | Disposition: A | Discharge status: Home / Self Care | Payer: Self-pay | Source: Ambulatory Visit | Attending: Unknown Physician Specialty

## 2016-06-24 DIAGNOSIS — Z7984 Long term (current) use of oral hypoglycemic drugs: Secondary | ICD-10-CM | POA: Diagnosis not present

## 2016-06-24 DIAGNOSIS — K5730 Diverticulosis of large intestine without perforation or abscess without bleeding: Secondary | ICD-10-CM | POA: Insufficient documentation

## 2016-06-24 DIAGNOSIS — Z1211 Encounter for screening for malignant neoplasm of colon: Secondary | ICD-10-CM | POA: Insufficient documentation

## 2016-06-24 DIAGNOSIS — Z6839 Body mass index (BMI) 39.0-39.9, adult: Secondary | ICD-10-CM | POA: Insufficient documentation

## 2016-06-24 DIAGNOSIS — E119 Type 2 diabetes mellitus without complications: Secondary | ICD-10-CM | POA: Insufficient documentation

## 2016-06-24 DIAGNOSIS — F419 Anxiety disorder, unspecified: Secondary | ICD-10-CM | POA: Insufficient documentation

## 2016-06-24 DIAGNOSIS — K648 Other hemorrhoids: Secondary | ICD-10-CM | POA: Insufficient documentation

## 2016-06-24 DIAGNOSIS — Z79899 Other long term (current) drug therapy: Secondary | ICD-10-CM | POA: Insufficient documentation

## 2016-06-24 HISTORY — PX: COLONOSCOPY WITH PROPOFOL: SHX5780

## 2016-06-24 LAB — GLUCOSE, CAPILLARY: GLUCOSE-CAPILLARY: 205 mg/dL — AB (ref 65–99)

## 2016-06-24 SURGERY — COLONOSCOPY WITH PROPOFOL
Anesthesia: General

## 2016-06-24 MED ORDER — LIDOCAINE 2% (20 MG/ML) 5 ML SYRINGE
INTRAMUSCULAR | Status: AC
  Filled 2016-06-24: qty 5

## 2016-06-24 MED ORDER — PROPOFOL 500 MG/50ML IV EMUL
INTRAVENOUS | Status: AC
  Filled 2016-06-24: qty 50

## 2016-06-24 MED ORDER — PROPOFOL 10 MG/ML IV BOLUS
INTRAVENOUS | Status: AC
  Filled 2016-06-24: qty 20

## 2016-06-24 MED ORDER — PROPOFOL 10 MG/ML IV BOLUS
INTRAVENOUS | Status: DC | PRN
  Administered 2016-06-24 (×3): 30 mg via INTRAVENOUS
  Administered 2016-06-24: 50 mg via INTRAVENOUS

## 2016-06-24 MED ORDER — SODIUM CHLORIDE 0.9 % IV SOLN
INTRAVENOUS | Status: DC | PRN
  Administered 2016-06-24: 09:00:00 via INTRAVENOUS

## 2016-06-24 MED ORDER — SODIUM CHLORIDE 0.9 % IV SOLN: INTRAVENOUS | Status: DC

## 2016-06-24 MED ORDER — PROPOFOL 500 MG/50ML IV EMUL
INTRAVENOUS | Status: DC | PRN
  Administered 2016-06-24: 125 ug/kg/min via INTRAVENOUS

## 2016-06-24 NOTE — Transfer of Care (Signed)
Immediate Anesthesia Transfer of Care Note  Patient: Jessica Tran  Procedure(s) Performed: Procedure(s): COLONOSCOPY WITH PROPOFOL (N/A)  Patient Location: PACU  Anesthesia Type:General  Level of Consciousness: patient cooperative and responds to stimulation  Airway & Oxygen Therapy: Patient Spontanous Breathing and Patient connected to nasal cannula oxygen  Post-op Assessment: Report given to RN and Post -op Vital signs reviewed and stable  Post vital signs: Reviewed and stable  Last Vitals:  Vitals:   06/24/16 0918 06/24/16 0919  BP:  (!) 102/57  Pulse: 88 87  Resp: 18 18  Temp: 36.1 C     Last Pain:  Vitals:   06/24/16 0918  TempSrc: Tympanic         Complications: No apparent anesthesia complications

## 2016-06-24 NOTE — Anesthesia Postprocedure Evaluation (Signed)
Anesthesia Post Note  Patient: Jessica Tran  Procedure(s) Performed: Procedure(s) (LRB): COLONOSCOPY WITH PROPOFOL (N/A)  Patient location during evaluation: Endoscopy Anesthesia Type: General Level of consciousness: awake and alert Pain management: pain level controlled Vital Signs Assessment: post-procedure vital signs reviewed and stable Respiratory status: spontaneous breathing, nonlabored ventilation, respiratory function stable and patient connected to nasal cannula oxygen Cardiovascular status: blood pressure returned to baseline and stable Postop Assessment: no signs of nausea or vomiting Anesthetic complications: no     Last Vitals:  Vitals:   06/24/16 0942 06/24/16 0950  BP:  125/82  Pulse: 80 90  Resp: 14 18  Temp:      Last Pain:  Vitals:   06/24/16 0918  TempSrc: Tympanic                 Ocie Stanzione S

## 2016-06-24 NOTE — Op Note (Signed)
[facility] Patient Name: Jessica Tran Procedure Date: 06/24/2016 8:35 AM MRN: [id number] Account #: [id number] Date of Birth: 05-29-1966 Admit Type: Outpatient Age: 50 Room: [facility] ENDO ROOM 1 Gender: Female Note Status: Finalized Procedure:            Colonoscopy Indications:          Screening for colorectal malignant neoplasm Providers:            Manya Silvas, MD Referring MD:         Ramonita Lab, MD (Referring MD) Medicines:            Propofol per Anesthesia Complications:        No immediate complications. Procedure:            Pre-Anesthesia Assessment:                       - After reviewing the risks and benefits, the patient                        was deemed in satisfactory condition to undergo the                        procedure.                       After obtaining informed consent, the colonoscope was                        passed under direct vision. Throughout the procedure,                        the patient's blood pressure, pulse, and oxygen                        saturations were monitored continuously. The                        Colonoscope was introduced through the anus and                        advanced to the the cecum, identified by appendiceal                        orifice and ileocecal valve. The colonoscopy was                        performed without difficulty. The patient tolerated the                        procedure well. The quality of the bowel preparation                        was good. Findings:      A few small-mouthed diverticula were found in the proximal descending       colon.      Internal hemorrhoids were found during endoscopy. The hemorrhoids were       small, medium-sized and Grade I (internal hemorrhoids that do not       prolapse).      The exam was otherwise without abnormality. Impression:           -  Diverticulosis in the proximal descending colon.                       - Internal  hemorrhoids.                       - The examination was otherwise normal.                       - No specimens collected. Recommendation:       - Repeat colonoscopy in 10 years for screening purposes. Manya Silvas, MD 06/24/2016 9:16:05 AM This report has been signed electronically. Number of Addenda: 0 Note Initiated On: 06/24/2016 8:35 AM Scope Withdrawal Time: 0 hours 8 minutes 37 seconds  Total Procedure Duration: 0 hours 11 minutes 41 seconds       [facility]

## 2016-06-24 NOTE — Anesthesia Preprocedure Evaluation (Signed)
Anesthesia Evaluation  Patient identified by MRN, date of birth, ID band Patient awake    Reviewed: Allergy & Precautions, NPO status , Patient's Chart, lab work & pertinent test results, reviewed documented beta blocker date and time   Airway Mallampati: III  TM Distance: >3 FB     Dental  (+) Chipped   Pulmonary           Cardiovascular      Neuro/Psych PSYCHIATRIC DISORDERS Anxiety    GI/Hepatic   Endo/Other  diabetes, Type 2Morbid obesity  Renal/GU      Musculoskeletal   Abdominal   Peds  Hematology   Anesthesia Other Findings   Reproductive/Obstetrics                             Anesthesia Physical Anesthesia Plan  ASA: III  Anesthesia Plan: General   Post-op Pain Management:    Induction: Intravenous  Airway Management Planned: Nasal Cannula  Additional Equipment:   Intra-op Plan:   Post-operative Plan:   Informed Consent: I have reviewed the patients History and Physical, chart, labs and discussed the procedure including the risks, benefits and alternatives for the proposed anesthesia with the patient or authorized representative who has indicated his/her understanding and acceptance.     Plan Discussed with: CRNA  Anesthesia Plan Comments:         Anesthesia Quick Evaluation

## 2016-06-24 NOTE — H&P (Signed)
Primary Care Physician:  Enid Derry, MD Primary Gastroenterologist:  Dr. Vira Agar  Pre-Procedure History & Physical: HPI:  Jessica Tran is a 50 y.o. female is here for an colonoscopy.   Past Medical History:  Diagnosis Date  . Anxiety   . Bilateral lower extremity edema   . Cardiac murmur   . Diabetes mellitus without complication ([place])   . Diverticulitis of colon   . Hepatic steatosis 12/22/2015  . Morbid obesity ([place]) 03/17/2015  . Obesity, Class II, BMI 35.0-39.9, with comorbidity (see actual BMI)     Past Surgical History:  Procedure Laterality Date  . ABDOMINAL HYSTERECTOMY  12/18/2001   LAPROSCOPIC SUPRACERVICAL WITH LYSES OF ADHESIONS  . ARTHROSCOPIC REPAIR ACL Right 07/10/2012   MCL TEAR 1995  . [place] CYST EXCISION  1995  . CHOLECYSTECTOMY  01/01/2004   LAPROSCOPIC  . LAPAROSCOPIC OOPHERECTOMY Left 05/15/2001  . LYSIS OF ADHESION  05/15/2001   LAPARSCOPIC  . SALPINGECTOMY Left 05/15/2001   LAPROSCOPIC  . SEPTOSTOMY  08/17/2006  . TONSILLECTOMY  09/27/1991    Prior to Admission medications   Medication Sig Start Date End Date Taking? Authorizing Provider  ALPRAZolam Duanne Moron) 0.5 MG tablet Take 0.5 mg by mouth daily.   Yes Historical Provider, MD  furosemide (LASIX) 20 MG tablet Take 20 mg by mouth daily as needed. Take 1-2 tablets every day as needed for swelling   Yes Historical Provider, MD  glipiZIDE (GLIPIZIDE XL) 10 MG 24 hr tablet Take 2 tablets (20 mg total) by mouth daily with breakfast. 01/07/16  Yes Arnetha Courser, MD  metFORMIN (GLUCOPHAGE-XR) 500 MG 24 hr tablet Take 1 tablet (500 mg total) by mouth every evening. appt and labs needed for further refills please 04/28/16  Yes Arnetha Courser, MD    Allergies as of 06/14/2016 - Review Complete 03/17/2015  Allergen Reaction Noted  . Clindamycin/lincomycin Swelling 03/17/2015  . Amoxicillin-pot clavulanate Diarrhea 12/14/2015  . Cefuroxime axetil Diarrhea 12/14/2015    Family History   Problem Relation Age of Onset  . Hyperlipidemia Mother   . Hypertension Mother   . Diabetes Mother   . Hyperlipidemia Father   . Hypertension Father     Social History   Social History  . Marital status: Married    Spouse name: N/A  . Number of children: N/A  . Years of education: N/A   Occupational History  . Not on file.   Social History Main Topics  . Smoking status: Never Smoker  . Smokeless tobacco: Never Used  . Alcohol use 0.0 oz/week     Comment: occasionally  . Drug use: No  . Sexual activity: Yes    Partners: Male   Other Topics Concern  . Not on file   Social History Narrative  . No narrative on file    Review of Systems: See HPI, otherwise negative ROS  Physical Exam: BP (!) 146/80   Pulse (!) 105   Temp 97.6 F (36.4 C) (Tympanic)   Resp 16   Ht 5\' 7"  (1.702 m)   Wt 114.8 kg (253 lb)   SpO2 98%   BMI 39.63 kg/m  General:   Alert,  pleasant and cooperative in NAD Head:  Normocephalic and atraumatic. Neck:  Supple; no masses or thyromegaly. Lungs:  Clear throughout to auscultation.    Heart:  Regular rate and rhythm. Abdomen:  Soft, nontender and nondistended. Normal bowel sounds, without guarding, and without rebound.   Neurologic:  Alert and  oriented x4;  grossly normal neurologically.  Impression/Plan: Jessica Tran is here for an colonoscopy to be performed for colon cancer screening  Risks, benefits, limitations, and alternatives regarding  colonoscopy have been reviewed with the patient.  Questions have been answered.  All parties agreeable.   Gaylyn Cheers, MD  06/24/2016, 8:42 AM

## 2016-06-28 ENCOUNTER — Encounter: Payer: Self-pay | Admitting: Unknown Physician Specialty

## 2016-11-08 ENCOUNTER — Other Ambulatory Visit: Payer: Self-pay | Admitting: Obstetrics & Gynecology

## 2016-11-08 DIAGNOSIS — Z1231 Encounter for screening mammogram for malignant neoplasm of breast: Secondary | ICD-10-CM

## 2016-11-25 ENCOUNTER — Ambulatory Visit
Admission: RE | Admit: 2016-11-25 | Discharge: 2016-11-25 | Disposition: A | Discharge status: Home / Self Care | Payer: 59 | Source: Ambulatory Visit | Attending: Obstetrics & Gynecology | Admitting: Obstetrics & Gynecology

## 2016-11-25 DIAGNOSIS — Z1231 Encounter for screening mammogram for malignant neoplasm of breast: Secondary | ICD-10-CM | POA: Insufficient documentation

## 2017-03-06 ENCOUNTER — Other Ambulatory Visit: Payer: Self-pay | Admitting: Physician Assistant

## 2017-03-06 DIAGNOSIS — R1032 Left lower quadrant pain: Secondary | ICD-10-CM

## 2017-03-07 ENCOUNTER — Ambulatory Visit
Admission: RE | Admit: 2017-03-07 | Discharge: 2017-03-07 | Disposition: A | Discharge status: Home / Self Care | Payer: 59 | Source: Ambulatory Visit | Attending: Physician Assistant | Admitting: Physician Assistant

## 2017-03-07 DIAGNOSIS — R1032 Left lower quadrant pain: Secondary | ICD-10-CM | POA: Diagnosis present

## 2017-03-07 DIAGNOSIS — J9811 Atelectasis: Secondary | ICD-10-CM | POA: Diagnosis not present

## 2017-03-07 DIAGNOSIS — K5790 Diverticulosis of intestine, part unspecified, without perforation or abscess without bleeding: Secondary | ICD-10-CM | POA: Insufficient documentation

## 2017-03-07 MED ORDER — IOPAMIDOL (ISOVUE-370) INJECTION 76%
100.0000 mL | Freq: Once | INTRAVENOUS | Status: AC | PRN
  Administered 2017-03-07: 100 mL via INTRAVENOUS

## 2017-06-27 DIAGNOSIS — C801 Malignant (primary) neoplasm, unspecified: Secondary | ICD-10-CM

## 2017-06-27 HISTORY — DX: Malignant (primary) neoplasm, unspecified: C80.1

## 2017-11-08 ENCOUNTER — Other Ambulatory Visit
Admission: RE | Admit: 2017-11-08 | Discharge: 2017-11-08 | Disposition: A | Discharge status: Home / Self Care | Payer: 59 | Source: Ambulatory Visit | Attending: Family Medicine | Admitting: Family Medicine

## 2017-11-08 DIAGNOSIS — R079 Chest pain, unspecified: Secondary | ICD-10-CM | POA: Insufficient documentation

## 2017-11-08 LAB — FIBRIN DERIVATIVES D-DIMER (ARMC ONLY): FIBRIN DERIVATIVES D-DIMER (ARMC): 498.33 ng{FEU}/mL (ref 0.00–499.00)

## 2017-11-24 ENCOUNTER — Other Ambulatory Visit: Payer: Self-pay | Admitting: Obstetrics & Gynecology

## 2017-11-24 DIAGNOSIS — Z1231 Encounter for screening mammogram for malignant neoplasm of breast: Secondary | ICD-10-CM

## 2017-12-08 ENCOUNTER — Ambulatory Visit
Admission: RE | Admit: 2017-12-08 | Discharge: 2017-12-08 | Disposition: A | Discharge status: Home / Self Care | Payer: 59 | Source: Ambulatory Visit | Attending: Obstetrics & Gynecology | Admitting: Obstetrics & Gynecology

## 2017-12-08 DIAGNOSIS — Z1231 Encounter for screening mammogram for malignant neoplasm of breast: Secondary | ICD-10-CM | POA: Insufficient documentation

## 2018-08-24 ENCOUNTER — Other Ambulatory Visit: Payer: Self-pay | Admitting: Otolaryngology

## 2018-08-24 DIAGNOSIS — R51 Headache: Principal | ICD-10-CM

## 2018-08-24 DIAGNOSIS — G4452 New daily persistent headache (NDPH): Secondary | ICD-10-CM

## 2018-08-24 DIAGNOSIS — R519 Headache, unspecified: Secondary | ICD-10-CM

## 2018-09-02 ENCOUNTER — Other Ambulatory Visit: Payer: 59

## 2018-09-02 ENCOUNTER — Ambulatory Visit
Admission: RE | Admit: 2018-09-02 | Discharge: 2018-09-02 | Disposition: A | Discharge status: Home / Self Care | Payer: Self-pay | Source: Ambulatory Visit | Attending: Otolaryngology | Admitting: Otolaryngology

## 2018-09-02 DIAGNOSIS — G4452 New daily persistent headache (NDPH): Secondary | ICD-10-CM

## 2018-09-02 DIAGNOSIS — R519 Headache, unspecified: Secondary | ICD-10-CM

## 2018-09-02 DIAGNOSIS — R51 Headache: Principal | ICD-10-CM

## 2018-09-02 MED ORDER — GADOBENATE DIMEGLUMINE 529 MG/ML IV SOLN
20.0000 mL | Freq: Once | INTRAVENOUS | Status: AC | PRN
  Administered 2018-09-02: 20 mL via INTRAVENOUS

## 2018-12-18 ENCOUNTER — Other Ambulatory Visit: Payer: Self-pay | Admitting: Internal Medicine

## 2018-12-18 DIAGNOSIS — Z1231 Encounter for screening mammogram for malignant neoplasm of breast: Secondary | ICD-10-CM

## 2018-12-25 ENCOUNTER — Other Ambulatory Visit: Payer: Self-pay

## 2018-12-25 ENCOUNTER — Ambulatory Visit
Admission: RE | Admit: 2018-12-25 | Discharge: 2018-12-25 | Disposition: A | Discharge status: Home / Self Care | Payer: BLUE CROSS/BLUE SHIELD | Source: Ambulatory Visit | Attending: Internal Medicine | Admitting: Internal Medicine

## 2018-12-25 ENCOUNTER — Encounter (INDEPENDENT_AMBULATORY_CARE_PROVIDER_SITE_OTHER): Payer: Self-pay

## 2018-12-25 DIAGNOSIS — Z1231 Encounter for screening mammogram for malignant neoplasm of breast: Secondary | ICD-10-CM | POA: Insufficient documentation

## 2019-05-31 ENCOUNTER — Other Ambulatory Visit: Payer: Self-pay

## 2019-05-31 DIAGNOSIS — Z20822 Contact with and (suspected) exposure to covid-19: Secondary | ICD-10-CM

## 2019-06-02 LAB — NOVEL CORONAVIRUS, NAA: SARS-CoV-2, NAA: NOT DETECTED

## 2019-09-24 ENCOUNTER — Other Ambulatory Visit: Payer: Self-pay | Admitting: Internal Medicine

## 2019-09-24 DIAGNOSIS — Z1231 Encounter for screening mammogram for malignant neoplasm of breast: Secondary | ICD-10-CM

## 2019-10-08 ENCOUNTER — Other Ambulatory Visit: Payer: Self-pay | Admitting: Physician Assistant

## 2019-10-08 DIAGNOSIS — N644 Mastodynia: Secondary | ICD-10-CM

## 2019-11-18 ENCOUNTER — Ambulatory Visit
Admission: RE | Admit: 2019-11-18 | Discharge: 2019-11-18 | Disposition: A | Discharge status: Home / Self Care | Payer: BLUE CROSS/BLUE SHIELD | Source: Ambulatory Visit | Attending: Physician Assistant | Admitting: Physician Assistant

## 2019-11-18 DIAGNOSIS — N644 Mastodynia: Secondary | ICD-10-CM | POA: Insufficient documentation

## 2020-01-20 DIAGNOSIS — M7541 Impingement syndrome of right shoulder: Secondary | ICD-10-CM | POA: Insufficient documentation

## 2020-01-28 ENCOUNTER — Other Ambulatory Visit: Payer: Self-pay | Admitting: Orthopedic Surgery

## 2020-01-28 DIAGNOSIS — M7541 Impingement syndrome of right shoulder: Secondary | ICD-10-CM

## 2020-01-30 ENCOUNTER — Other Ambulatory Visit: Payer: Self-pay | Admitting: Orthopedic Surgery

## 2020-01-30 DIAGNOSIS — M7541 Impingement syndrome of right shoulder: Secondary | ICD-10-CM

## 2020-02-06 ENCOUNTER — Ambulatory Visit
Admission: RE | Admit: 2020-02-06 | Discharge: 2020-02-06 | Disposition: A | Discharge status: Home / Self Care | Payer: BLUE CROSS/BLUE SHIELD | Source: Ambulatory Visit | Attending: Orthopedic Surgery | Admitting: Orthopedic Surgery

## 2020-02-06 ENCOUNTER — Other Ambulatory Visit: Payer: Self-pay

## 2020-02-06 DIAGNOSIS — M7541 Impingement syndrome of right shoulder: Secondary | ICD-10-CM

## 2020-02-06 MED ORDER — IOPAMIDOL (ISOVUE-M 200) INJECTION 41%
13.0000 mL | Freq: Once | INTRAMUSCULAR | Status: AC
  Administered 2020-02-06: 13 mL via INTRA_ARTICULAR

## 2020-06-28 ENCOUNTER — Other Ambulatory Visit: Payer: Self-pay

## 2020-06-28 ENCOUNTER — Emergency Department: Payer: BLUE CROSS/BLUE SHIELD

## 2020-06-28 ENCOUNTER — Encounter: Payer: Self-pay | Admitting: Emergency Medicine

## 2020-06-28 DIAGNOSIS — E119 Type 2 diabetes mellitus without complications: Secondary | ICD-10-CM | POA: Insufficient documentation

## 2020-06-28 DIAGNOSIS — Z85820 Personal history of malignant melanoma of skin: Secondary | ICD-10-CM | POA: Insufficient documentation

## 2020-06-28 DIAGNOSIS — R42 Dizziness and giddiness: Secondary | ICD-10-CM | POA: Insufficient documentation

## 2020-06-28 DIAGNOSIS — R0602 Shortness of breath: Secondary | ICD-10-CM | POA: Diagnosis not present

## 2020-06-28 DIAGNOSIS — Z7984 Long term (current) use of oral hypoglycemic drugs: Secondary | ICD-10-CM | POA: Insufficient documentation

## 2020-06-28 LAB — BASIC METABOLIC PANEL
Anion gap: 12 (ref 5–15)
BUN: 15 mg/dL (ref 6–20)
CO2: 28 mmol/L (ref 22–32)
Calcium: 9.3 mg/dL (ref 8.9–10.3)
Chloride: 100 mmol/L (ref 98–111)
Creatinine, Ser: 0.73 mg/dL (ref 0.44–1.00)
GFR, Estimated: >60 mL/min (ref >60)
Glucose, Bld: 222 mg/dL — ABNORMAL HIGH (ref 70–99)
Potassium: 4.1 mmol/L (ref 3.5–5.1)
Sodium: 140 mmol/L (ref 135–145)

## 2020-06-28 LAB — CBC
HCT: 41.1 % (ref 36.0–46.0)
Hemoglobin: 13.4 g/dL (ref 12.0–15.0)
MCH: 26.9 pg (ref 26.0–34.0)
MCHC: 32.6 g/dL (ref 30.0–36.0)
MCV: 82.5 fL (ref 80.0–100.0)
Platelets: 267 10*3/uL (ref 150–400)
RBC: 4.98 MIL/uL (ref 3.87–5.11)
RDW: 12.6 % (ref 11.5–15.5)
WBC: 10.2 10*3/uL (ref 4.0–10.5)
nRBC: 0 % (ref 0.0–0.2)

## 2020-06-28 LAB — TROPONIN I (HIGH SENSITIVITY): Troponin I (High Sensitivity): 10 ng/L (ref <18)

## 2020-06-28 NOTE — ED Triage Notes (Signed)
Patient with complaint of feeling dizzy, short of breath and nauseated times one hour.

## 2020-06-29 ENCOUNTER — Emergency Department
Admission: EM | Admit: 2020-06-29 | Discharge: 2020-06-29 | Disposition: A | Discharge status: Home / Self Care | Payer: BLUE CROSS/BLUE SHIELD | Attending: Emergency Medicine | Admitting: Emergency Medicine

## 2020-06-29 DIAGNOSIS — R42 Dizziness and giddiness: Secondary | ICD-10-CM

## 2020-06-29 LAB — TROPONIN I (HIGH SENSITIVITY): Troponin I (High Sensitivity): 12 ng/L (ref <18)

## 2020-06-29 MED ORDER — MECLIZINE HCL 25 MG PO TABS: 25.0000 mg | ORAL_TABLET | Freq: Three times a day (TID) | ORAL | 1 refills | Status: DC | PRN

## 2020-06-29 MED ORDER — DIAZEPAM 5 MG PO TABS
5.0000 mg | ORAL_TABLET | Freq: Once | ORAL | Status: AC
  Administered 2020-06-29: 5 mg via ORAL
  Filled 2020-06-29: qty 1

## 2020-06-29 MED ORDER — MECLIZINE HCL 25 MG PO TABS
50.0000 mg | ORAL_TABLET | Freq: Once | ORAL | Status: AC
  Administered 2020-06-29: 50 mg via ORAL
  Filled 2020-06-29: qty 2

## 2020-06-29 NOTE — Discharge Instructions (Addendum)
As we discussed, your dizziness and vertigo is very likely to be coming from your inner ear.  Much less likely to represent a stroke.  Due to the possibility, albeit very minimal, for stroke as the source of your symptoms, if you develop any dizziness that will not go away despite the medications, or dizziness and combination with headache, fever or passing out, please return to the ED immediately.  Otherwise follow-up with one of the doctors in your ENT clinic and use the meclizine as needed up to 3-4 times per day for your vertigo.

## 2020-06-29 NOTE — ED Provider Notes (Signed)
[facility] Emergency Department Provider Note ____________________________________________   Event Date/Time   First MD Initiated Contact with Patient 06/29/20 0249     (approximate)  I have reviewed the triage vital signs and the nursing notes.  HISTORY  Chief Complaint Dizziness and Shortness of Breath   HPI Jessica Tran is a 55 y.o. femalewho presents to the ED for evaluation of dizziness and SOB.   Chart review indicates history of obesity, DM on multiple oral agents.  Patient reports being in her typical state of health until developing sudden onset vertiginous dizziness and associated nausea while she was lying in bed watching television at about 9 PM on 1/2.  She reports the symptoms persisted for about 2 hours before improving on their own.  She reports feeling better now, and does report some worsening symptoms when she looks towards the left or turns her head to the left.  She reports associated symptom of shortness of breath in the setting of her vertiginous dizziness.  Denies cough.  Denies fevers, headache, syncope, trauma, recent illnesses, chest pain .  Past Medical History:  Diagnosis Date  . Anxiety   . Bilateral lower extremity edema   . Cancer ([place]) 2019   melanoma  . Cardiac murmur   . Diabetes mellitus without complication ([place])    Pt takes Metformin and glipizide  . Diverticulitis of colon   . Hepatic steatosis 12/22/2015  . Morbid obesity ([place]) 03/17/2015  . Obesity, Class II, BMI 35.0-39.9, with comorbidity (see actual BMI)     Patient Active Problem List   Diagnosis Date Noted  . Hepatic steatosis 12/22/2015  . Breast cancer screening 10/05/2015  . History of diverticulitis 03/17/2015  . Diabetes mellitus type II, uncontrolled ([place]) 03/17/2015  . Bilateral lower extremity edema 03/17/2015  . Generalized anxiety disorder 03/17/2015  . Morbid obesity ([place]) 03/17/2015    Past Surgical History:  Procedure Laterality  Date  . ABDOMINAL HYSTERECTOMY  12/18/2001   LAPROSCOPIC SUPRACERVICAL WITH LYSES OF ADHESIONS  . ARTHROSCOPIC REPAIR ACL Right 07/10/2012   MCL TEAR 1995  . [place] CYST EXCISION  1995  . CHOLECYSTECTOMY  01/01/2004   LAPROSCOPIC  . COLONOSCOPY WITH PROPOFOL N/A 06/24/2016   Procedure: COLONOSCOPY WITH PROPOFOL;  Surgeon: Manya Silvas, MD;  Location: [facility] ENDOSCOPY;  Service: Endoscopy;  Laterality: N/A;  . LAPAROSCOPIC OOPHERECTOMY Left 05/15/2001  . LYSIS OF ADHESION  05/15/2001   LAPARSCOPIC  . SALPINGECTOMY Left 05/15/2001   LAPROSCOPIC  . SEPTOSTOMY  08/17/2006  . TONSILLECTOMY  09/27/1991    Prior to Admission medications   Medication Sig Start Date End Date Taking? Authorizing Provider  meclizine (ANTIVERT) 25 MG tablet Take 1 tablet (25 mg total) by mouth 3 (three) times daily as needed for dizziness. 06/29/20  Yes Vladimir Crofts, MD  ALPRAZolam Duanne Moron) 0.5 MG tablet Take 0.5 mg by mouth daily.    [provider]  furosemide (LASIX) 20 MG tablet Take 20 mg by mouth daily as needed. Take 1-2 tablets every day as needed for swelling    [provider]  glipiZIDE (GLIPIZIDE XL) 10 MG 24 hr tablet Take 2 tablets (20 mg total) by mouth daily with breakfast. 01/07/16   Lada, Satira Anis, MD  metFORMIN (GLUCOPHAGE-XR) 500 MG 24 hr tablet Take 1 tablet (500 mg total) by mouth every evening. appt and labs needed for further refills please 04/28/16   Arnetha Courser, MD    Allergies Clindamycin/lincomycin, Amoxicillin-pot clavulanate, and Cefuroxime axetil  Family History  Problem Relation Age of Onset  . Hyperlipidemia Mother   . Hypertension Mother   . Diabetes Mother   . Hyperlipidemia Father   . Hypertension Father   . Breast cancer Neg Hx     Social History Social History   Tobacco Use  . Smoking status: Never Smoker  . Smokeless tobacco: Never Used  Substance Use Topics  . Alcohol use: Yes    Alcohol/week: 0.0 standard drinks    Comment:  occasionally  . Drug use: No    Review of Systems  Constitutional: No fever/chills Eyes: No visual changes. ENT: No sore throat. Cardiovascular: Denies chest pain. Respiratory: Denies shortness of breath. Gastrointestinal: No abdominal pain.  no vomiting.  No diarrhea.  No constipation.  Positive for nausea Genitourinary: Negative for dysuria. Musculoskeletal: Negative for back pain. Skin: Negative for rash. Neurological: Negative for headaches, focal weakness or numbness.  Positive for dizziness  ____________________________________________   PHYSICAL EXAM:  VITAL SIGNS: Vitals:   06/29/20 0430 06/29/20 0500  BP: (!) 116/52 (!) 116/45  Pulse: 73 77  Resp: 12 12  Temp:    SpO2: 96% 96%     Constitutional: Alert and oriented. Well appearing and in no acute distress.  Obese, laying on her side and conversational in full sentences. Eyes: Conjunctivae are normal. PERRL. EOMI. Head: Atraumatic. Nose: No congestion/rhinnorhea. Mouth/Throat: Mucous membranes are moist.  Oropharynx non-erythematous. Neck: No stridor. No cervical spine tenderness to palpation. Cardiovascular: Normal rate, regular rhythm. Grossly normal heart sounds.  Good peripheral circulation. Respiratory: Normal respiratory effort.  No retractions. Lungs CTAB. Gastrointestinal: Soft , nondistended, nontender to palpation. No CVA tenderness. Musculoskeletal: No lower extremity tenderness nor edema.  No joint effusions. No signs of acute trauma. Neurologic:  Normal speech and language. No gross focal neurologic deficits are appreciated. Cranial nerves II through XII intact 5/5 strength and sensation in all 4 extremities Leftward nystagmus with leftward horizontal EOM. Skin:  Skin is warm, dry and intact. No rash noted. Psychiatric: Mood and affect are normal. Speech and behavior are normal.  ____________________________________________   LABS (all labs ordered are listed, but only abnormal results are  displayed)  Labs Reviewed  BASIC METABOLIC PANEL - Abnormal; Notable for the following components:      Result Value   Glucose, Bld 222 (*)    All other components within normal limits  CBC  TROPONIN I (HIGH SENSITIVITY)  TROPONIN I (HIGH SENSITIVITY)   ____________________________________________  12 Lead EKG  Rhythm, rate of 96 bpm.  Normal axis.  Normal intervals.  T wave inversions isolated to leads III and aVF without comparison.  No further evidence of acute ischemia. ____________________________________________  RADIOLOGY  ED MD interpretation: 2 view CXR without evidence of acute cardiopulmonary pathology.  Official radiology report(s): DG Chest 2 View  Result Date: 06/28/2020 CLINICAL DATA:  Shortness of breath EXAM: CHEST - 2 VIEW COMPARISON:  08/22/2007 FINDINGS: The heart size and mediastinal contours are within normal limits. Both lungs are clear. The visualized skeletal structures are unremarkable. IMPRESSION: No active cardiopulmonary disease. Electronically Signed   By: Donavan Foil M.D.   On: 06/28/2020 22:31    ____________________________________________   PROCEDURES and INTERVENTIONS  Procedure(s) performed (including Critical Care):  .1-3 Lead EKG Interpretation Performed by: Vladimir Crofts, MD Authorized by: Vladimir Crofts, MD     Interpretation: normal     ECG rate:  90   ECG rate assessment: normal     Rhythm: sinus rhythm  Ectopy: none     Conduction: normal      Medications  diazepam (VALIUM) tablet 5 mg (has no administration in time range)  meclizine (ANTIVERT) tablet 50 mg (50 mg Oral Given 06/29/20 0412)    ____________________________________________   MDM / ED COURSE   55 year old woman with history of obesity and diabetes presented to the ED with positional vertigo, likely due to BPPV, and amenable to outpatient management.  Normal vitals on room air.  Exam without evidence of neurovascular deficits, trauma or distress.  She  further looks improved and is more freely moving within her room after meclizine ministration.  Blood work, EKG and CXR without evidence of acute pathology.  Her symptoms are likely peripheral in nature.  We discussed the possibility of stroke or central cause of vertigo at length, and she indicates desire to go home and attempt a trial of outpatient management.  I provided prescription for meclizine and recommendations to follow-up with ENT.  We thoroughly discussed return precautions for the ED and patient is medically stable for discharge home.   Clinical Course as of 06/29/20 0527  Mon Jun 29, 2020  9485 Shared decision-making yields plan to empirically treat for BPPV with meclizine, and if she is not feeling better about 30 minutes pursue MRI. [DS]  0523 Reassessed.  Patient reports feeling better.  She locates that she is not feeling any dizziness while at rest, but reports some mild and residual vertigo when she gets up out of bed when she went to void a moment ago.  Reports that self resolved after lying back down.  Long discussion about the possibility of central vertigo, posterior stroke, versus BPPV.  Patient reports significant anxiety regarding MRI, she reports only having been able to get an MRI in the past and an open MRI machine.  She indicates that because she feels well at this point, she would like to go home with meclizine prescription and close return precautions.  We thoroughly discussed return precautions for the ED. [DS]    Clinical Course User Index [DS] Delton Prairie, MD    ____________________________________________   FINAL CLINICAL IMPRESSION(S) / ED DIAGNOSES  Final diagnoses:  Dizziness  Vertigo     ED Discharge Orders         Ordered    meclizine (ANTIVERT) 25 MG tablet  3 times daily PRN        06/29/20 0525           Anandi Abramo   Note:  This document was prepared using Dragon voice recognition software and may include unintentional dictation  errors.   Delton Prairie, MD 06/29/20 [phone]

## 2020-07-17 ENCOUNTER — Other Ambulatory Visit: Payer: Self-pay | Admitting: Neurology

## 2020-07-17 ENCOUNTER — Other Ambulatory Visit: Payer: Self-pay | Admitting: Otolaryngology

## 2020-07-17 DIAGNOSIS — R42 Dizziness and giddiness: Secondary | ICD-10-CM

## 2020-07-28 ENCOUNTER — Other Ambulatory Visit: Payer: Self-pay

## 2020-07-28 ENCOUNTER — Ambulatory Visit
Admission: RE | Admit: 2020-07-28 | Discharge: 2020-07-28 | Disposition: A | Discharge status: Home / Self Care | Payer: BLUE CROSS/BLUE SHIELD | Source: Ambulatory Visit | Attending: Otolaryngology | Admitting: Otolaryngology

## 2020-07-28 DIAGNOSIS — R42 Dizziness and giddiness: Secondary | ICD-10-CM

## 2020-07-28 MED ORDER — GADOBENATE DIMEGLUMINE 529 MG/ML IV SOLN
20.0000 mL | Freq: Once | INTRAVENOUS | Status: AC | PRN
  Administered 2020-07-28: 20 mL via INTRAVENOUS

## 2020-08-10 ENCOUNTER — Other Ambulatory Visit: Payer: Self-pay | Admitting: Neurology

## 2020-08-10 DIAGNOSIS — G35 Multiple sclerosis: Secondary | ICD-10-CM

## 2020-08-21 ENCOUNTER — Encounter: Payer: Self-pay | Admitting: Internal Medicine

## 2020-08-21 DIAGNOSIS — G4719 Other hypersomnia: Secondary | ICD-10-CM

## 2020-08-30 ENCOUNTER — Other Ambulatory Visit: Payer: Self-pay

## 2020-08-30 ENCOUNTER — Ambulatory Visit
Admission: RE | Admit: 2020-08-30 | Discharge: 2020-08-30 | Disposition: A | Discharge status: Home / Self Care | Payer: BLUE CROSS/BLUE SHIELD | Source: Ambulatory Visit | Attending: Neurology | Admitting: Neurology

## 2020-08-30 DIAGNOSIS — G35 Multiple sclerosis: Secondary | ICD-10-CM

## 2020-08-30 MED ORDER — GADOBENATE DIMEGLUMINE 529 MG/ML IV SOLN
20.0000 mL | Freq: Once | INTRAVENOUS | Status: AC | PRN
  Administered 2020-08-30: 20 mL via INTRAVENOUS

## 2020-09-06 NOTE — Progress Notes (Signed)
[place]  Portable Polysomnogram Report Part 1 Phone: [phone] Fax: [phone]  Patient Name: Jessica Tran, Jessica Tran Recording Device: Glee Arvin  D.O.B.: 06-10-1966 Acquisition Number: [id number]  Referring Physician: Carloyn Manner, MD,  Acquisition Date: 08/21/2020   History: The patient is a 55 years old female who was referred for evaluation of possible sleep apnea.  Medical History: diabetes.  Medications: albuterol, Flovent, glipizide, meclizine, metformin, promethazine, Valtrex.  PROCEDURE  The unattended portable polysomnogram was conducted on the night of 08/21/2020.  The following parameters were monitored: Nasal and oral airflow, and body position. Additionally, thoracic and abdominal movements were recorded by inductance plethysmography. Oxygen saturation (SpO2) and heart rate (ECG) was monitored using a pulse Oximeter.  The tracing was scored using 30 second epochs. Hypopneas were scored per AASM definition VIIID1.B (4% desaturation).    Description: The total recording time was 714.5 minutes. Sleep parameters are not recorded.  Respiratory monitoring demonstrated significant snoring across the night in all positions. There were a total of 53 apneas and hypopneas for a Respiratory Event Index of 4.6 apneas and hypopneas per hour of recording, which increased to 9.0 in the supine position. The average duration of the respiratory events was 23.2 seconds with a maximum duration of 56.0 seconds. The respiratory events were associated with peripheral oxygen desaturations on the average to 95 %. The lowest oxygen desaturation associated with a respiratory event was 78 %. Additionally, the mean oxygen saturation was 95 %. The total duration of oxygen < 90% was 7.4 minutes and <80% was 0.1 minutes.   Cardiac monitoring- The average heart rate during the recording was 74.2 bpm.  Impression: This routine overnight portable polysomnogram did not demonstrate  obstructive sleep apnea. Overall the Respiratory Event Index was 4.6 apneas and hypopneas per hour of recording. Most of the events occurred in the supine position.   Recommendations:     1. A negative home study does not necessarily rule out significant sleep apnea. A repeat study in the supine position is recommended. 2. Would recommend weight loss in a patient with a BMI of 39.0 lb/in2.  Allyne Gee, MD [facility] Diplomate ABMS Pulmonary Critical Care Medicine and Sleep Medicine Electronically reviewed and digitally signed  [place]  Portable Polysomnogram Report Part 2 Phone: [phone] Fax: [phone]   Study Date: 08/21/2020  Patient Name: Jessica Tran, Jessica Tran Recording Device: Glee Arvin  Sex: F Height: 67.0 in.  D.O.B.: 1965/08/25 Weight: 249.0 lbs.  Age: 29 years B.M.I: 39.0 lb/in2   Times and Durations  Lights off clock time:  8:14:04 PM Total Recording Time (TRT): 714.5 minutes  Lights on clock time: 8:08:34 AM Time In Bed (TIB): 714.5 minutes   Summary  AHI 4.6 OAI 1.1 CAI 0.0 Lowest Desat 78  AHI is the number of apneas and hypopneas per hour. OAI is the number of obstructive apneas per hour. CAI is the number of central apneas per hour. Lowest Desat is the lowest blood oxygen level that lasted at least 2 seconds.  RESPIRATORY EVENTS   Index (#/hour) Total # of Events Mean duration  (sec) Max duration  (sec) # of Events by Position       Supine Prone Left Right Up  Central Apneas 0.0 0 0.0 0.0 0    0 0 0  Obstructive Apneas 1.1 13 16.0 30.0 13    0 0 0  Mixed Apneas 0.0 0 0.0 0.0 0    0 0 0  Hypopneas 3.4 40 25.6  56.0 38    0 2 0  Apneas + Hypopneas 4.6 53 23.2 56.0 51    0 2 0  Total 4.6 53 23.2 56.0 51    0 2 0  Time in Position 339.5    262.3 94.8 15.5  AHI in Position 9.0    0.0 1.3 0.0    Oximetry Summary   Dur. (min) % TIB  <90 % 7.4 1.0  <85 % 0.6 0.1  <80 % 0.1 0.0  <70 % 0.0 0.0  Total Dur (min) < 89 4.4 min  Average (%) 95  Total #  of Desats 115  Desat Index (#/hour) 10.0  Desat Max (%) 18  Desat Max dur (sec) 100.0  Lowest SpO2 % during sleep 78  Duration of Min SpO2 (sec) 5    Heart Rate Stats  Mean HR during sleep (BPM)  Highest HR during sleep 111  (BPM)  Highest HR during TIB  117 (BPM)    Snoring Summary  Total Snoring Episodes 242  Total Duration with Snoring 94.2 minutes  Mean Duration of Snoring 23.4 seconds  Percentage of Snoring 13.5 %

## 2020-09-06 NOTE — Procedures (Signed)
[place]  Portable Polysomnogram Report Part 1 Phone: [phone] Fax: [phone]  Patient Name: Jessica Tran, Jessica Tran Recording Device: Glee Arvin  D.O.B.: Dec 11, 1965 Acquisition Number: [id number]  Referring Physician: Carloyn Manner, MD,  Acquisition Date: 08/21/2020   History: The patient is a 55 years old female who was referred for evaluation of possible sleep apnea.  Medical History: diabetes.  Medications: albuterol, Flovent, glipizide, meclizine, metformin, promethazine, Valtrex.  PROCEDURE  The unattended portable polysomnogram was conducted on the night of 08/21/2020.  The following parameters were monitored: Nasal and oral airflow, and body position. Additionally, thoracic and abdominal movements were recorded by inductance plethysmography. Oxygen saturation (SpO2) and heart rate (ECG) was monitored using a pulse Oximeter.  The tracing was scored using 30 second epochs. Hypopneas were scored per AASM definition VIIID1.B (4% desaturation).    Description: The total recording time was 714.5 minutes. Sleep parameters are not recorded.  Respiratory monitoring demonstrated significant snoring across the night in all positions. There were a total of 53 apneas and hypopneas for a Respiratory Event Index of 4.6 apneas and hypopneas per hour of recording, which increased to 9.0 in the supine position. The average duration of the respiratory events was 23.2 seconds with a maximum duration of 56.0 seconds. The respiratory events were associated with peripheral oxygen desaturations on the average to 95 %. The lowest oxygen desaturation associated with a respiratory event was 78 %. Additionally, the mean oxygen saturation was 95 %. The total duration of oxygen < 90% was 7.4 minutes and <80% was 0.1 minutes.   Cardiac monitoring- The average heart rate during the recording was 74.2 bpm.  Impression: This routine overnight portable polysomnogram did not demonstrate  obstructive sleep apnea. Overall the Respiratory Event Index was 4.6 apneas and hypopneas per hour of recording. Most of the events occurred in the supine position.   Recommendations:     1. A negative home study does not necessarily rule out significant sleep apnea. A repeat study in the supine position is recommended. 2. Would recommend weight loss in a patient with a BMI of 39.0 lb/in2.  Allyne Gee, MD [facility] Diplomate ABMS Pulmonary Critical Care Medicine and Sleep Medicine Electronically reviewed and digitally signed  [place]  Portable Polysomnogram Report Part 2 Phone: [phone] Fax: [phone]   Study Date: 08/21/2020  Patient Name: Jessica Tran, Jessica Tran Recording Device: Glee Arvin  Sex: F Height: 67.0 in.  D.O.B.: Apr 04, 1966 Weight: 249.0 lbs.  Age: 33 years B.M.I: 39.0 lb/in2   Times and Durations  Lights off clock time:  8:14:04 PM Total Recording Time (TRT): 714.5 minutes  Lights on clock time: 8:08:34 AM Time In Bed (TIB): 714.5 minutes   Summary  AHI 4.6 OAI 1.1 CAI 0.0 Lowest Desat 78  AHI is the number of apneas and hypopneas per hour. OAI is the number of obstructive apneas per hour. CAI is the number of central apneas per hour. Lowest Desat is the lowest blood oxygen level that lasted at least 2 seconds.  RESPIRATORY EVENTS   Index (#/hour) Total # of Events Mean duration  (sec) Max duration  (sec) # of Events by Position       Supine Prone Left Right Up  Central Apneas 0.0 0 0.0 0.0 0    0 0 0  Obstructive Apneas 1.1 13 16.0 30.0 13    0 0 0  Mixed Apneas 0.0 0 0.0 0.0 0    0 0 0  Hypopneas 3.4 40 25.6  56.0 38    0 2 0  Apneas + Hypopneas 4.6 53 23.2 56.0 51    0 2 0  Total 4.6 53 23.2 56.0 51    0 2 0  Time in Position 339.5    262.3 94.8 15.5  AHI in Position 9.0    0.0 1.3 0.0    Oximetry Summary   Dur. (min) % TIB  <90 % 7.4 1.0  <85 % 0.6 0.1  <80 % 0.1 0.0  <70 % 0.0 0.0  Total Dur (min) < 89 4.4 min  Average (%) 95  Total #  of Desats 115  Desat Index (#/hour) 10.0  Desat Max (%) 18  Desat Max dur (sec) 100.0  Lowest SpO2 % during sleep 78  Duration of Min SpO2 (sec) 5    Heart Rate Stats  Mean HR during sleep (BPM)  Highest HR during sleep 111  (BPM)  Highest HR during TIB  117 (BPM)    Snoring Summary  Total Snoring Episodes 242  Total Duration with Snoring 94.2 minutes  Mean Duration of Snoring 23.4 seconds  Percentage of Snoring 13.5 %

## 2020-10-12 ENCOUNTER — Other Ambulatory Visit: Payer: Self-pay

## 2020-10-12 ENCOUNTER — Encounter: Payer: Self-pay | Admitting: Gastroenterology

## 2020-10-12 ENCOUNTER — Ambulatory Visit (INDEPENDENT_AMBULATORY_CARE_PROVIDER_SITE_OTHER): Payer: BLUE CROSS/BLUE SHIELD | Admitting: Gastroenterology

## 2020-10-12 VITALS — BP 146/83 | HR 88 | Temp 98.3°F | Ht 67.0 in | Wt 259.4 lb

## 2020-10-12 DIAGNOSIS — K219 Gastro-esophageal reflux disease without esophagitis: Secondary | ICD-10-CM | POA: Diagnosis not present

## 2020-10-12 DIAGNOSIS — R1013 Epigastric pain: Secondary | ICD-10-CM

## 2020-10-12 NOTE — Patient Instructions (Signed)
Gastroesophageal Reflux Disease, Adult  Gastroesophageal reflux (GER) happens when acid from the stomach flows up into the tube that connects the mouth and the stomach (esophagus). Normally, food travels down the esophagus and stays in the stomach to be digested. With GER, food and stomach acid sometimes move back up into the esophagus. You may have a disease called gastroesophageal reflux disease (GERD) if the reflux:  Happens often.  Causes frequent or very bad symptoms.  Causes problems such as damage to the esophagus. When this happens, the esophagus becomes sore and swollen. Over time, GERD can make small holes (ulcers) in the lining of the esophagus. What are the causes? This condition is caused by a problem with the muscle between the esophagus and the stomach. When this muscle is weak or not normal, it does not close properly to keep food and acid from coming back up from the stomach. The muscle can be weak because of:  Tobacco use.  Pregnancy.  Having a certain type of hernia (hiatal hernia).  Alcohol use.  Certain foods and drinks, such as coffee, chocolate, onions, and peppermint. What increases the risk?  Being overweight.  Having a disease that affects your connective tissue.  Taking NSAIDs, such a ibuprofen. What are the signs or symptoms?  Heartburn.  Difficult or painful swallowing.  The feeling of having a lump in the throat.  A bitter taste in the mouth.  Bad breath.  Having a lot of saliva.  Having an upset or bloated stomach.  Burping.  Chest pain. Different conditions can cause chest pain. Make sure you see your doctor if you have chest pain.  Shortness of breath or wheezing.  A long-term cough or a cough at night.  Wearing away of the surface of teeth (tooth enamel).  Weight loss. How is this treated?  Making changes to your diet.  Taking medicine.  Having surgery. Treatment will depend on how bad your symptoms are. Follow these  instructions at home: Eating and drinking  Follow a diet as told by your doctor. You may need to avoid foods and drinks such as: ? Coffee and tea, with or without caffeine. ? Drinks that contain alcohol. ? Energy drinks and sports drinks. ? Bubbly (carbonated) drinks or sodas. ? Chocolate and cocoa. ? Peppermint and mint flavorings. ? Garlic and onions. ? Horseradish. ? Spicy and acidic foods. These include peppers, chili powder, curry powder, vinegar, hot sauces, and BBQ sauce. ? Citrus fruit juices and citrus fruits, such as oranges, lemons, and limes. ? Tomato-based foods. These include red sauce, chili, salsa, and pizza with red sauce. ? Fried and fatty foods. These include donuts, french fries, potato chips, and high-fat dressings. ? High-fat meats. These include hot dogs, rib eye steak, sausage, ham, and bacon. ? High-fat dairy items, such as whole milk, butter, and cream cheese.  Eat small meals often. Avoid eating large meals.  Avoid drinking large amounts of liquid with your meals.  Avoid eating meals during the 2-3 hours before bedtime.  Avoid lying down right after you eat.  Do not exercise right after you eat.   Lifestyle  Do not smoke or use any products that contain nicotine or tobacco. If you need help quitting, ask your doctor.  Try to lower your stress. If you need help doing this, ask your doctor.  If you are overweight, lose an amount of weight that is healthy for you. Ask your doctor about a safe weight loss goal.   General instructions  Pay attention to any changes in your symptoms.  Take over-the-counter and prescription medicines only as told by your doctor.  Do not take aspirin, ibuprofen, or other NSAIDs unless your doctor says it is okay.  Wear loose clothes. Do not wear anything tight around your waist.  Raise (elevate) the head of your bed about 6 inches (15 cm). You may need to use a wedge to do this.  Avoid bending over if this makes your  symptoms worse.  Keep all follow-up visits. Contact a doctor if:  You have new symptoms.  You lose weight and you do not know why.  You have trouble swallowing or it hurts to swallow.  You have wheezing or a cough that keeps happening.  You have a hoarse voice.  Your symptoms do not get better with treatment. Get help right away if:  You have sudden pain in your arms, neck, jaw, teeth, or back.  You suddenly feel sweaty, dizzy, or light-headed.  You have chest pain or shortness of breath.  You vomit and the vomit is green, yellow, or black, or it looks like blood or coffee grounds.  You faint.  Your poop (stool) is red, bloody, or black.  You cannot swallow, drink, or eat. These symptoms may represent a serious problem that is an emergency. Do not wait to see if the symptoms will go away. Get medical help right away. Call your local emergency services (911 in the [place] Do not drive yourself to the hospital. Summary  If a person has gastroesophageal reflux disease (GERD), food and stomach acid move back up into the esophagus and cause symptoms or problems such as damage to the esophagus.  Treatment will depend on how bad your symptoms are.  Follow a diet as told by your doctor.  Take all medicines only as told by your doctor. This information is not intended to replace advice given to you by your health care provider. Make sure you discuss any questions you have with your health care provider. Document Revised: 12/23/2019 Document Reviewed: 12/23/2019 Elsevier Patient Education  [place].

## 2020-10-12 NOTE — Progress Notes (Signed)
Cephas Darby, MD [address]  [place]  [place], [place] [address]  Main: [phone]  Fax: [phone]    Gastroenterology Consultation  Referring Provider:     Adin Hector, MD Primary Care Physician:  Adin Hector, MD Primary Gastroenterologist:  Dr. Cephas Darby Reason for Consultation:     Chronic GERD, epigastric discomfort        HPI:   Jessica Tran is a 55 y.o. female referred by Dr. Caryl Comes, Tonette Bihari, MD  for consultation & management of chronic GERD, epigastric discomfort.  Patient has history of metabolic syndrome is seen in consultation for chronic history of constant clearing of throat, especially nighttime.  She does have intermittent epigastric discomfort.  She has history of diabetes, hemoglobin A1c was 11, most recently improved to 8.  This corresponded with improvement in epigastric discomfort as well.  She drinks 1 cup of coffee most of the days in the morning.  She was taking Nexium as needed over-the-counter.  She reports having had a colonoscopy more than 10 years ago.  She works at [employer]  NSAIDs: None  Antiplts/Anticoagulants/Anti thrombotics: None  GI Procedures:  Colonoscopy 06/24/16 - Diverticulosis in the proximal descending colon. - Internal hemorrhoids. - The examination was otherwise normal. - No specimens collected.  Past Medical History:  Diagnosis Date  . Anxiety   . Bilateral lower extremity edema   . Cancer ([place]) 2019   melanoma  . Cardiac murmur   . Diabetes mellitus without complication ([place])    Pt takes Metformin and glipizide  . Diverticulitis of colon   . Hepatic steatosis 12/22/2015  . Morbid obesity ([place]) 03/17/2015  . Obesity, Class II, BMI 35.0-39.9, with comorbidity (see actual BMI)     Past Surgical History:  Procedure Laterality Date  . ABDOMINAL HYSTERECTOMY  12/18/2001   LAPROSCOPIC SUPRACERVICAL WITH LYSES OF ADHESIONS  . ARTHROSCOPIC REPAIR ACL Right 07/10/2012   MCL TEAR 1995  .  [place] CYST EXCISION  1995  . CHOLECYSTECTOMY  01/01/2004   LAPROSCOPIC  . COLONOSCOPY WITH PROPOFOL N/A 06/24/2016   Procedure: COLONOSCOPY WITH PROPOFOL;  Surgeon: Manya Silvas, MD;  Location: [facility] ENDOSCOPY;  Service: Endoscopy;  Laterality: N/A;  . LAPAROSCOPIC OOPHERECTOMY Left 05/15/2001  . LYSIS OF ADHESION  05/15/2001   LAPARSCOPIC  . SALPINGECTOMY Left 05/15/2001   LAPROSCOPIC  . SEPTOSTOMY  08/17/2006  . TONSILLECTOMY  09/27/1991    Current Outpatient Medications:  .  albuterol (VENTOLIN HFA) 108 (90 Base) MCG/ACT inhaler, Inhale into the lungs., Disp: , Rfl:  .  ALPRAZolam (XANAX) 0.25 MG tablet, alprazolam 0.25 mg tablet, Disp: , Rfl:  .  atorvastatin (LIPITOR) 10 MG tablet, Take 1 tablet by mouth daily., Disp: , Rfl:  .  GE100 BLOOD GLUCOSE TEST test strip, USE TO TEST THREE TIMES DAILY, Disp: , Rfl:  .  glipiZIDE (GLIPIZIDE XL) 10 MG 24 hr tablet, Take 2 tablets (20 mg total) by mouth daily with breakfast., Disp: 60 tablet, Rfl: 5 .  insulin NPH-regular Human (70-30) 100 UNIT/ML injection, Inject into the skin., Disp: , Rfl:  .  Insulin Syringe-Needle U-100 (INSULIN SYRINGE 1CC/31GX5/16") 31G X 5/16" 1 ML MISC, See admin instructions., Disp: , Rfl:  .  meclizine (ANTIVERT) 25 MG tablet, Take 1 tablet (25 mg total) by mouth 3 (three) times daily as needed for dizziness., Disp: 30 tablet, Rfl: 1 .  metFORMIN (GLUCOPHAGE-XR) 500 MG 24 hr tablet, Take 1 tablet (500  mg total) by mouth every evening. appt and labs needed for further refills please, Disp: 30 tablet, Rfl: 0 .  valACYclovir (VALTREX) 500 MG tablet, valacyclovir 500 mg tablet, Disp: , Rfl:    Family History  Problem Relation Age of Onset  . Hyperlipidemia Mother   . Hypertension Mother   . Diabetes Mother   . Hyperlipidemia Father   . Hypertension Father   . Breast cancer Neg Hx      Social History   Tobacco Use  . Smoking status: Never Smoker  . Smokeless tobacco: Never Used  Substance Use  Topics  . Alcohol use: Yes    Alcohol/week: 0.0 standard drinks    Comment: occasionally  . Drug use: No    Allergies as of 10/12/2020 - Review Complete 10/12/2020  Allergen Reaction Noted  . Clindamycin/lincomycin Swelling 03/17/2015  . Amoxicillin-pot clavulanate Diarrhea 12/14/2015  . Cefuroxime axetil Diarrhea 12/14/2015  . Pravastatin Diarrhea 01/12/2018    Review of Systems:    All systems reviewed and negative except where noted in HPI.   Physical Exam:  BP (!) 146/83 (BP Location: Left Arm, Patient Position: Sitting, Cuff Size: Large)   Pulse 88   Temp 98.3 F (36.8 C) (Oral)   Ht 5\' 7"  (1.702 m)   Wt 259 lb 6 oz (117.7 kg)   BMI 40.62 kg/m  No LMP recorded. Patient has had a hysterectomy.  General:   Alert,  Well-developed, well-nourished, pleasant and cooperative in NAD Head:  Normocephalic and atraumatic. Eyes:  Sclera clear, no icterus.   Conjunctiva pink. Ears:  Normal auditory acuity. Nose:  No deformity, discharge, or lesions. Mouth:  No deformity or lesions,oropharynx pink & moist. Neck:  Supple; no masses or thyromegaly. Lungs:  Respirations even and unlabored.  Clear throughout to auscultation.   No wheezes, crackles, or rhonchi. No acute distress. Heart:  Regular rate and rhythm; no murmurs, clicks, rubs, or gallops. Abdomen:  Normal bowel sounds. Soft, non-tender and non-distended without masses, hepatosplenomegaly or hernias noted.  No guarding or rebound tenderness.   Rectal: Not performed Msk:  Symmetrical without gross deformities. Good, equal movement & strength bilaterally. Pulses:  Normal pulses noted. Extremities:  No clubbing or edema.  No cyanosis. Neurologic:  Alert and oriented x3;  grossly normal neurologically. Skin:  Intact without significant lesions or rashes. No jaundice. Psych:  Alert and cooperative. Normal mood and affect.  Imaging Studies: No abdominal imaging  Assessment and Plan:   Jessica Tran is a 55 y.o. female  with metabolic syndrome is seen in consultation for chronic heartburn, epigastric discomfort  Chronic heartburn and clearing of throat Discussed about antireflux lifestyle, information provided Recommend EGD for Barrett's screening and erosive esophagitis Recommend gastric biopsies for epigastric discomfort Discussed about tight control of diabetes   Follow up based on the EGD results   Cephas Darby, MD

## 2020-10-20 ENCOUNTER — Encounter: Payer: Self-pay | Admitting: Gastroenterology

## 2020-10-21 ENCOUNTER — Ambulatory Visit: Payer: BLUE CROSS/BLUE SHIELD | Admitting: Anesthesiology

## 2020-10-21 ENCOUNTER — Encounter: Payer: Self-pay | Admitting: Gastroenterology

## 2020-10-21 ENCOUNTER — Ambulatory Visit
Admission: RE | Admit: 2020-10-21 | Discharge: 2020-10-21 | Disposition: A | Discharge status: Home / Self Care | Payer: BLUE CROSS/BLUE SHIELD | Attending: Gastroenterology | Admitting: Gastroenterology

## 2020-10-21 ENCOUNTER — Other Ambulatory Visit: Payer: Self-pay

## 2020-10-21 ENCOUNTER — Encounter
Admission: RE | Disposition: A | Discharge status: Home / Self Care | Payer: Self-pay | Source: Home / Self Care | Attending: Gastroenterology

## 2020-10-21 DIAGNOSIS — Z13810 Encounter for screening for upper gastrointestinal disorder: Secondary | ICD-10-CM | POA: Insufficient documentation

## 2020-10-21 DIAGNOSIS — E1165 Type 2 diabetes mellitus with hyperglycemia: Secondary | ICD-10-CM

## 2020-10-21 DIAGNOSIS — K259 Gastric ulcer, unspecified as acute or chronic, without hemorrhage or perforation: Secondary | ICD-10-CM | POA: Diagnosis not present

## 2020-10-21 DIAGNOSIS — K219 Gastro-esophageal reflux disease without esophagitis: Secondary | ICD-10-CM | POA: Diagnosis not present

## 2020-10-21 DIAGNOSIS — Z880 Allergy status to penicillin: Secondary | ICD-10-CM | POA: Diagnosis not present

## 2020-10-21 DIAGNOSIS — Z7984 Long term (current) use of oral hypoglycemic drugs: Secondary | ICD-10-CM | POA: Diagnosis not present

## 2020-10-21 DIAGNOSIS — Z794 Long term (current) use of insulin: Secondary | ICD-10-CM | POA: Insufficient documentation

## 2020-10-21 DIAGNOSIS — R1013 Epigastric pain: Secondary | ICD-10-CM | POA: Diagnosis present

## 2020-10-21 DIAGNOSIS — Z881 Allergy status to other antibiotic agents status: Secondary | ICD-10-CM | POA: Insufficient documentation

## 2020-10-21 DIAGNOSIS — Z9049 Acquired absence of other specified parts of digestive tract: Secondary | ICD-10-CM | POA: Diagnosis not present

## 2020-10-21 DIAGNOSIS — Z85820 Personal history of malignant melanoma of skin: Secondary | ICD-10-CM | POA: Insufficient documentation

## 2020-10-21 DIAGNOSIS — Z888 Allergy status to other drugs, medicaments and biological substances status: Secondary | ICD-10-CM | POA: Insufficient documentation

## 2020-10-21 DIAGNOSIS — Z79899 Other long term (current) drug therapy: Secondary | ICD-10-CM | POA: Insufficient documentation

## 2020-10-21 DIAGNOSIS — K2100 Gastro-esophageal reflux disease with esophagitis, without bleeding: Secondary | ICD-10-CM | POA: Diagnosis not present

## 2020-10-21 DIAGNOSIS — K319 Disease of stomach and duodenum, unspecified: Secondary | ICD-10-CM | POA: Diagnosis not present

## 2020-10-21 HISTORY — PX: ESOPHAGOGASTRODUODENOSCOPY (EGD) WITH PROPOFOL: SHX5813

## 2020-10-21 LAB — GLUCOSE, CAPILLARY: Glucose-Capillary: 123 mg/dL — ABNORMAL HIGH (ref 70–99)

## 2020-10-21 SURGERY — ESOPHAGOGASTRODUODENOSCOPY (EGD) WITH PROPOFOL
Anesthesia: General

## 2020-10-21 MED ORDER — OMEPRAZOLE 40 MG PO CPDR: 40.0000 mg | DELAYED_RELEASE_CAPSULE | Freq: Two times a day (BID) | ORAL | 2 refills | Status: DC

## 2020-10-21 MED ORDER — LIDOCAINE HCL (CARDIAC) PF 100 MG/5ML IV SOSY
PREFILLED_SYRINGE | INTRAVENOUS | Status: DC | PRN
  Administered 2020-10-21: 100 mg via INTRAVENOUS

## 2020-10-21 MED ORDER — PROPOFOL 10 MG/ML IV BOLUS
INTRAVENOUS | Status: DC | PRN
  Administered 2020-10-21: 50 mg via INTRAVENOUS

## 2020-10-21 MED ORDER — SODIUM CHLORIDE 0.9 % IV SOLN: INTRAVENOUS | Status: DC

## 2020-10-21 MED ORDER — PROPOFOL 500 MG/50ML IV EMUL
INTRAVENOUS | Status: DC | PRN
  Administered 2020-10-21: 150 ug/kg/min via INTRAVENOUS

## 2020-10-21 NOTE — Anesthesia Postprocedure Evaluation (Signed)
Anesthesia Post Note  Patient: Jessica Tran  Procedure(s) Performed: ESOPHAGOGASTRODUODENOSCOPY (EGD) WITH PROPOFOL (N/A )  Patient location during evaluation: Endoscopy Anesthesia Type: General Level of consciousness: awake and alert Pain management: pain level controlled Vital Signs Assessment: post-procedure vital signs reviewed and stable Respiratory status: spontaneous breathing, nonlabored ventilation, respiratory function stable and patient connected to nasal cannula oxygen Cardiovascular status: blood pressure returned to baseline and stable Postop Assessment: no apparent nausea or vomiting Anesthetic complications: no   No complications documented.   Last Vitals:  Vitals:   10/21/20 0900 10/21/20 0910  BP: 135/76 (!) 149/88  Pulse: 76 69  Resp: 15 14  Temp:    SpO2: 97% 98%    Last Pain:  Vitals:   10/21/20 0850  TempSrc: Temporal  PainSc:                  Precious Haws Mishika Flippen

## 2020-10-21 NOTE — Op Note (Signed)
[facility] Gastroenterology Patient Name: Jessica Tran Procedure Date: 10/21/2020 8:34 AM MRN: [id number] Account #: [id number] Date of Birth: 1965/09/21 Admit Type: Outpatient Age: 55 Room: [facility] ENDO ROOM 4 Gender: Female Note Status: Finalized Procedure:             Upper GI endoscopy Indications:           Screening for Barrett's esophagus, Screening for                         Barrett's esophagus in patient at risk for this                         condition, Epigastric abdominal pain Providers:             Lin Landsman MD, MD Referring MD:          Ramonita Lab, MD (Referring MD) Medicines:             General Anesthesia Complications:         No immediate complications. Estimated blood loss: None. Procedure:             Pre-Anesthesia Assessment:                        - Prior to the procedure, a History and Physical was                         performed, and patient medications and allergies were                         reviewed. The patient is competent. The risks and                         benefits of the procedure and the sedation options and                         risks were discussed with the patient. All questions                         were answered and informed consent was obtained.                         Patient identification and proposed procedure were                         verified by the physician, the nurse, the                         anesthesiologist, the anesthetist and the technician                         in the pre-procedure area in the procedure room in the                         endoscopy suite. Mental Status Examination: alert and                         oriented. Airway Examination: normal oropharyngeal  airway and neck mobility. Respiratory Examination:                         clear to auscultation. CV Examination: normal.                         Prophylactic Antibiotics: The patient does not  require                         prophylactic antibiotics. Prior Anticoagulants: The                         patient has taken no previous anticoagulant or                         antiplatelet agents. ASA Grade Assessment: III - A                         patient with severe systemic disease. After reviewing                         the risks and benefits, the patient was deemed in                         satisfactory condition to undergo the procedure. The                         anesthesia plan was to use general anesthesia.                         Immediately prior to administration of medications,                         the patient was re-assessed for adequacy to receive                         sedatives. The heart rate, respiratory rate, oxygen                         saturations, blood pressure, adequacy of pulmonary                         ventilation, and response to care were monitored                         throughout the procedure. The physical status of the                         patient was re-assessed after the procedure.                        After obtaining informed consent, the endoscope was                         passed under direct vision. Throughout the procedure,                         the patient's blood pressure, pulse, and oxygen  saturations were monitored continuously. The Endoscope                         was introduced through the mouth, and advanced to the                         second part of duodenum. The upper GI endoscopy was                         accomplished without difficulty. The patient tolerated                         the procedure well. Findings:      The duodenal bulb and second portion of the duodenum were normal.      A few localized diminutive erosions with no bleeding and no stigmata of       recent bleeding were found in the gastric antrum. Biopsies were taken       with a cold forceps for Helicobacter pylori  testing.      The gastric body was normal. Biopsies were taken with a cold forceps for       Helicobacter pylori testing.      The cardia and gastric fundus were normal on retroflexion.      Esophagogastric landmarks were identified: the gastroesophageal junction       was found at 36 cm from the incisors.      LA Grade A (one or more mucosal breaks less than 5 mm, not extending       between tops of 2 mucosal folds) esophagitis with no bleeding was found       at the gastroesophageal junction. Impression:            - Normal duodenal bulb and second portion of the                         duodenum.                        - Erosive gastropathy with no bleeding and no stigmata                         of recent bleeding. Biopsied.                        - Normal gastric body. Biopsied.                        - Esophagogastric landmarks identified.                        - LA Grade A reflux esophagitis with no bleeding. Recommendation:        - Await pathology results.                        - Discharge patient to home (with escort).                        - Resume previous diet today.                        - Follow an antireflux regimen.                        -  Use a proton pump inhibitor PO BID for 3 months. Procedure Code(s):     --- Professional ---                        [phone], Esophagogastroduodenoscopy, flexible,                         transoral; with biopsy, single or multiple Diagnosis Code(s):     --- Professional ---                        K31.89, Other diseases of stomach and duodenum                        K21.00, Gastro-esophageal reflux disease with                         esophagitis, without bleeding                        Z13.810, Encounter for screening for upper                         gastrointestinal disorder                        R10.13, Epigastric pain CPT copyright 2019 American Medical Association. All rights reserved. The codes documented in this report are  preliminary and upon coder review may  be revised to meet current compliance requirements. Dr. Ulyess Mort Lin Landsman MD, MD 10/21/2020 8:51:00 AM This report has been signed electronically. Number of Addenda: 0 Note Initiated On: 10/21/2020 8:34 AM Estimated Blood Loss:  Estimated blood loss: none.      [facility]

## 2020-10-21 NOTE — H&P (Signed)
Cephas Darby, MD [address]  [place]  [place], [place] [address]  Main: [phone]  Fax: [phone] Pager: [phone]  Primary Care Physician:  Adin Hector, MD Primary Gastroenterologist:  Dr. Cephas Darby  Pre-Procedure History & Physical: HPI:  Jessica Tran is a 55 y.o. female is here for an endoscopy.   Past Medical History:  Diagnosis Date  . Anxiety   . Bilateral lower extremity edema   . Cancer ([place]) 2019   melanoma  . Cardiac murmur   . Diabetes mellitus without complication ([place])    Pt takes Metformin and glipizide  . Diverticulitis of colon   . Hepatic steatosis 12/22/2015  . Morbid obesity ([place]) 03/17/2015  . Obesity, Class II, BMI 35.0-39.9, with comorbidity (see actual BMI)     Past Surgical History:  Procedure Laterality Date  . ABDOMINAL HYSTERECTOMY  12/18/2001   LAPROSCOPIC SUPRACERVICAL WITH LYSES OF ADHESIONS  . ARTHROSCOPIC REPAIR ACL Right 07/10/2012   MCL TEAR 1995  . [place] CYST EXCISION  1995  . CHOLECYSTECTOMY  01/01/2004   LAPROSCOPIC  . COLONOSCOPY WITH PROPOFOL N/A 06/24/2016   Procedure: COLONOSCOPY WITH PROPOFOL;  Surgeon: Manya Silvas, MD;  Location: [facility] ENDOSCOPY;  Service: Endoscopy;  Laterality: N/A;  . LAPAROSCOPIC OOPHERECTOMY Left 05/15/2001  . LYSIS OF ADHESION  05/15/2001   LAPARSCOPIC  . SALPINGECTOMY Left 05/15/2001   LAPROSCOPIC  . SEPTOSTOMY  08/17/2006  . TONSILLECTOMY  09/27/1991    Prior to Admission medications   Medication Sig Start Date End Date Taking? Authorizing Provider  albuterol (VENTOLIN HFA) 108 (90 Base) MCG/ACT inhaler Inhale into the lungs. 11/02/17  Yes [provider]  ALPRAZolam (XANAX) 0.25 MG tablet alprazolam 0.25 mg tablet   Yes [provider]  atorvastatin (LIPITOR) 10 MG tablet Take 1 tablet by mouth daily. 07/14/20  Yes [provider]  glipiZIDE (GLIPIZIDE XL) 10 MG 24 hr tablet Take 2 tablets (20 mg total) by mouth daily with  breakfast. 01/07/16  Yes Lada, Satira Anis, MD  insulin NPH-regular Human (70-30) 100 UNIT/ML injection Inject into the skin. 03/30/20 03/30/21 Yes [provider]  Insulin Syringe-Needle U-100 (INSULIN SYRINGE 1CC/31GX5/16") 31G X 5/16" 1 ML MISC See admin instructions. 03/30/20 03/30/21 Yes [provider]  metFORMIN (GLUCOPHAGE-XR) 500 MG 24 hr tablet Take 1 tablet (500 mg total) by mouth every evening. appt and labs needed for further refills please 04/28/16  Yes Lada, Satira Anis, MD  valACYclovir (VALTREX) 500 MG tablet valacyclovir 500 mg tablet   Yes [provider]  GE100 BLOOD GLUCOSE TEST test strip USE TO TEST THREE TIMES DAILY 09/21/20   [provider]  meclizine (ANTIVERT) 25 MG tablet Take 1 tablet (25 mg total) by mouth 3 (three) times daily as needed for dizziness. 06/29/20   Vladimir Crofts, MD    Allergies as of 10/13/2020 - Review Complete 10/12/2020  Allergen Reaction Noted  . Clindamycin/lincomycin Swelling 03/17/2015  . Amoxicillin-pot clavulanate Diarrhea 12/14/2015  . Cefuroxime axetil Diarrhea 12/14/2015  . Pravastatin Diarrhea 01/12/2018    Family History  Problem Relation Age of Onset  . Hyperlipidemia Mother   . Hypertension Mother   . Diabetes Mother   . Hyperlipidemia Father   . Hypertension Father   . Breast cancer Neg Hx     Social History   Socioeconomic History  . Marital status: Married    Spouse name: Not on file  . Number of children: Not on file  . Years  of education: Not on file  . Highest education level: Not on file  Occupational History  . Not on file  Tobacco Use  . Smoking status: Never Smoker  . Smokeless tobacco: Never Used  Vaping Use  . Vaping Use: Never used  Substance and Sexual Activity  . Alcohol use: Yes    Alcohol/week: 0.0 standard drinks    Comment: occasionally  . Drug use: No  . Sexual activity: Not on file  Other Topics Concern  . Not on file  Social History Narrative  . Not on file    Social Determinants of Health   Financial Resource Strain: Not on file  Food Insecurity: Not on file  Transportation Needs: Not on file  Physical Activity: Not on file  Stress: Not on file  Social Connections: Not on file  Intimate Partner Violence: Not on file    Review of Systems: See HPI, otherwise negative ROS  Physical Exam: BP (!) 152/80   Pulse 75   Temp (!) 97.4 F (36.3 C) (Temporal)   Resp 17   Ht 5\' 7"  (1.702 m)   Wt 117.7 kg   SpO2 99%   BMI 40.64 kg/m  General:   Alert,  pleasant and cooperative in NAD Head:  Normocephalic and atraumatic. Neck:  Supple; no masses or thyromegaly. Lungs:  Clear throughout to auscultation.    Heart:  Regular rate and rhythm. Abdomen:  Soft, nontender and nondistended. Normal bowel sounds, without guarding, and without rebound.   Neurologic:  Alert and  oriented x4;  grossly normal neurologically.  Impression/Plan: Jessica Tran is here for an endoscopy to be performed for epigastric pain  Risks, benefits, limitations, and alternatives regarding  endoscopy have been reviewed with the patient.  Questions have been answered.  All parties agreeable.   Sherri Sear, MD  10/21/2020, 8:09 AM

## 2020-10-21 NOTE — Transfer of Care (Signed)
Immediate Anesthesia Transfer of Care Note  Patient: Jessica Tran  Procedure(s) Performed: ESOPHAGOGASTRODUODENOSCOPY (EGD) WITH PROPOFOL (N/A )  Patient Location: PACU and Endoscopy Unit  Anesthesia Type:General  Level of Consciousness: drowsy  Airway & Oxygen Therapy: Patient Spontanous Breathing  Post-op Assessment: Report given to RN and Post -op Vital signs reviewed and stable  Post vital signs: Reviewed and stable  Last Vitals:  Vitals Value Taken Time  BP 130/72 10/21/20 0851  Temp    Pulse 82 10/21/20 0851  Resp 19 10/21/20 0851  SpO2 99 % 10/21/20 0851  Vitals shown include unvalidated device data.  Last Pain:  Vitals:   10/21/20 0752  TempSrc: Temporal  PainSc: 0-No pain         Complications: No complications documented.

## 2020-10-21 NOTE — Anesthesia Preprocedure Evaluation (Signed)
Anesthesia Evaluation  Patient identified by MRN, date of birth, ID band Patient awake    Reviewed: Allergy & Precautions, H&P , NPO status , Patient's Chart, lab work & pertinent test results  History of Anesthesia Complications Negative for: history of anesthetic complications  Airway Mallampati: III  TM Distance: <3 FB Neck ROM: full    Dental  (+) Chipped   Pulmonary neg pulmonary ROS, neg shortness of breath,    Pulmonary exam normal        Cardiovascular Exercise Tolerance: Good (-) angina(-) Past MI Normal cardiovascular exam+ Valvular Problems/Murmurs      Neuro/Psych PSYCHIATRIC DISORDERS negative neurological ROS     GI/Hepatic negative GI ROS, Neg liver ROS, neg GERD  ,  Endo/Other  diabetes, Type 2  Renal/GU negative Renal ROS  negative genitourinary   Musculoskeletal   Abdominal   Peds  Hematology negative hematology ROS (+)   Anesthesia Other Findings Past Medical History: No date: Anxiety No date: Bilateral lower extremity edema 2019: Cancer ([place])     Comment:  melanoma No date: Cardiac murmur No date: Diabetes mellitus without complication ([facility])     Comment:  Pt takes Metformin and glipizide No date: Diverticulitis of colon 12/22/2015: Hepatic steatosis 03/17/2015: Morbid obesity ([place]) No date: Obesity, Class II, BMI 35.0-39.9, with comorbidity (see  actual BMI)  Past Surgical History: 12/18/2001: ABDOMINAL HYSTERECTOMY     Comment:  LAPROSCOPIC SUPRACERVICAL WITH LYSES OF ADHESIONS 07/10/2012: ARTHROSCOPIC REPAIR ACL; Right     Comment:  MCL TEAR 1995 1995: [place] CYST EXCISION 01/01/2004: CHOLECYSTECTOMY     Comment:  LAPROSCOPIC 06/24/2016: COLONOSCOPY WITH PROPOFOL; N/A     Comment:  Procedure: COLONOSCOPY WITH PROPOFOL;  Surgeon: Manya Silvas, MD;  Location: [facility] ENDOSCOPY;  Service:               Endoscopy;  Laterality: N/A; 05/15/2001: LAPAROSCOPIC  OOPHERECTOMY; Left 05/15/2001: LYSIS OF ADHESION     Comment:  LAPARSCOPIC 05/15/2001: SALPINGECTOMY; Left     Comment:  LAPROSCOPIC 08/17/2006: SEPTOSTOMY 09/27/1991: TONSILLECTOMY  BMI    Body Mass Index: 40.64 kg/m      Reproductive/Obstetrics negative OB ROS                             Anesthesia Physical Anesthesia Plan  ASA: III  Anesthesia Plan: General   Post-op Pain Management:    Induction: Intravenous  PONV Risk Score and Plan: Propofol infusion and TIVA  Airway Management Planned: Natural Airway and Nasal Cannula  Additional Equipment:   Intra-op Plan:   Post-operative Plan:   Informed Consent: I have reviewed the patients History and Physical, chart, labs and discussed the procedure including the risks, benefits and alternatives for the proposed anesthesia with the patient or authorized representative who has indicated his/her understanding and acceptance.     Dental Advisory Given  Plan Discussed with: Anesthesiologist, CRNA and Surgeon  Anesthesia Plan Comments: (Patient consented for risks of anesthesia including but not limited to:  - adverse reactions to medications - risk of airway placement if required - damage to eyes, teeth, lips or other oral mucosa - nerve damage due to positioning  - sore throat or hoarseness - Damage to heart, brain, nerves, lungs, other parts of body or loss of life  Patient voiced understanding.)        Anesthesia Quick Evaluation

## 2020-10-22 ENCOUNTER — Encounter: Payer: Self-pay | Admitting: Gastroenterology

## 2020-10-22 LAB — SURGICAL PATHOLOGY

## 2020-10-30 ENCOUNTER — Ambulatory Visit: Payer: BLUE CROSS/BLUE SHIELD | Attending: Otolaryngology

## 2020-10-30 DIAGNOSIS — G4733 Obstructive sleep apnea (adult) (pediatric): Secondary | ICD-10-CM | POA: Diagnosis not present

## 2020-11-02 ENCOUNTER — Other Ambulatory Visit: Payer: Self-pay

## 2020-11-03 ENCOUNTER — Other Ambulatory Visit: Payer: Self-pay | Admitting: Internal Medicine

## 2020-11-03 DIAGNOSIS — Z1231 Encounter for screening mammogram for malignant neoplasm of breast: Secondary | ICD-10-CM

## 2020-11-16 ENCOUNTER — Emergency Department
Admission: EM | Admit: 2020-11-16 | Discharge: 2020-11-16 | Disposition: A | Discharge status: Home / Self Care | Payer: BLUE CROSS/BLUE SHIELD | Attending: Emergency Medicine | Admitting: Emergency Medicine

## 2020-11-16 ENCOUNTER — Emergency Department: Payer: BLUE CROSS/BLUE SHIELD

## 2020-11-16 ENCOUNTER — Encounter: Payer: Self-pay | Admitting: Emergency Medicine

## 2020-11-16 ENCOUNTER — Other Ambulatory Visit: Payer: Self-pay

## 2020-11-16 DIAGNOSIS — E119 Type 2 diabetes mellitus without complications: Secondary | ICD-10-CM | POA: Diagnosis not present

## 2020-11-16 DIAGNOSIS — Z794 Long term (current) use of insulin: Secondary | ICD-10-CM | POA: Diagnosis not present

## 2020-11-16 DIAGNOSIS — Z85828 Personal history of other malignant neoplasm of skin: Secondary | ICD-10-CM | POA: Insufficient documentation

## 2020-11-16 DIAGNOSIS — R079 Chest pain, unspecified: Secondary | ICD-10-CM | POA: Insufficient documentation

## 2020-11-16 DIAGNOSIS — Z20822 Contact with and (suspected) exposure to covid-19: Secondary | ICD-10-CM | POA: Insufficient documentation

## 2020-11-16 DIAGNOSIS — Z7984 Long term (current) use of oral hypoglycemic drugs: Secondary | ICD-10-CM | POA: Insufficient documentation

## 2020-11-16 LAB — TROPONIN I (HIGH SENSITIVITY)
Troponin I (High Sensitivity): 4 ng/L (ref <18)
Troponin I (High Sensitivity): 7 ng/L (ref <18)

## 2020-11-16 LAB — BASIC METABOLIC PANEL
Anion gap: 9 (ref 5–15)
BUN: 14 mg/dL (ref 6–20)
CO2: 26 mmol/L (ref 22–32)
Calcium: 9.3 mg/dL (ref 8.9–10.3)
Chloride: 103 mmol/L (ref 98–111)
Creatinine, Ser: 0.68 mg/dL (ref 0.44–1.00)
GFR, Estimated: >60 mL/min (ref >60)
Glucose, Bld: 167 mg/dL — ABNORMAL HIGH (ref 70–99)
Potassium: 4.3 mmol/L (ref 3.5–5.1)
Sodium: 138 mmol/L (ref 135–145)

## 2020-11-16 LAB — RESP PANEL BY RT-PCR (FLU A&B, COVID) ARPGX2
Influenza A by PCR: NEGATIVE
Influenza B by PCR: NEGATIVE
SARS Coronavirus 2 by RT PCR: NEGATIVE

## 2020-11-16 LAB — CBC
HCT: 40.3 % (ref 36.0–46.0)
Hemoglobin: 13.3 g/dL (ref 12.0–15.0)
MCH: 26.8 pg (ref 26.0–34.0)
MCHC: 33 g/dL (ref 30.0–36.0)
MCV: 81.3 fL (ref 80.0–100.0)
Platelets: 286 10*3/uL (ref 150–400)
RBC: 4.96 MIL/uL (ref 3.87–5.11)
RDW: 13.1 % (ref 11.5–15.5)
WBC: 8.9 10*3/uL (ref 4.0–10.5)
nRBC: 0 % (ref 0.0–0.2)

## 2020-11-16 MED ORDER — ONDANSETRON 4 MG PO TBDP
4.0000 mg | ORAL_TABLET | Freq: Once | ORAL | Status: AC
  Administered 2020-11-16: 4 mg via ORAL
  Filled 2020-11-16: qty 1

## 2020-11-16 NOTE — ED Notes (Signed)
See triage note.  Says she continues to have headache, nausea, no chest pain, but still feesl the fluttering.  She is alert and taking deep breaths.  Able to talk without any short of breath.  Says this happened in January.  Says she had neuro workup then.

## 2020-11-16 NOTE — ED Provider Notes (Signed)
[facility] Emergency Department Provider Note   ____________________________________________   Event Date/Time   First MD Initiated Contact with Patient 11/16/20 1510     (approximate)  I have reviewed the triage vital signs and the nursing notes.   HISTORY  Chief Complaint Chest Pain  HPI EMIRETH SAFRIT is a 55 y.o. female with the below medical history, presents herself to the ED via personal vehicle, for evaluation of chest pain, heart racing, and a sense of near syncope.  Patient describes feeling of her normal level of health and wellbeing when she awoke this morning.  She presented to work as per usual, and was at lunch when she began to experience her symptoms.  Patient presented to the ED for evaluation of her symptoms.  She was hypertensive and slightly tachypneic on presentation.  She appears mildly anxious by the triage nurse report.  The time of this evaluation, the patient is reporting some improvement of her symptoms.  She denies any ongoing chest pain, shortness of breath, diaphoresis, nausea, vomiting, or dizziness.  She also denies any headache, weakness or distal paresthesias.  He has taken 2 Excedrin migraine strength medication for headache that started a few minutes after her symptom onset.  Past Medical History:  Diagnosis Date  . Anxiety   . Bilateral lower extremity edema   . Cancer ([place]) 2019   melanoma  . Cardiac murmur   . Diabetes mellitus without complication ([place])    Pt takes Metformin and glipizide  . Diverticulitis of colon   . Hepatic steatosis 12/22/2015  . Morbid obesity ([place]) 03/17/2015  . Obesity, Class II, BMI 35.0-39.9, with comorbidity (see actual BMI)     Patient Active Problem List   Diagnosis Date Noted  . Chronic GERD   . Epigastric pain   . Impingement syndrome of right shoulder region 01/20/2020  . BMI 40.0-44.9, adult ([place]) 04/27/2016  . Anxiety state 04/27/2016  . Hepatic steatosis 12/22/2015  .  Breast cancer screening 10/05/2015  . History of diverticulitis 03/17/2015  . Diabetes mellitus type II, uncontrolled ([place]) 03/17/2015  . Bilateral lower extremity edema 03/17/2015  . Generalized anxiety disorder 03/17/2015  . Morbid obesity ([place]) 03/17/2015    Past Surgical History:  Procedure Laterality Date  . ABDOMINAL HYSTERECTOMY  12/18/2001   LAPROSCOPIC SUPRACERVICAL WITH LYSES OF ADHESIONS  . ARTHROSCOPIC REPAIR ACL Right 07/10/2012   MCL TEAR 1995  . [place] CYST EXCISION  1995  . CHOLECYSTECTOMY  01/01/2004   LAPROSCOPIC  . COLONOSCOPY WITH PROPOFOL N/A 06/24/2016   Procedure: COLONOSCOPY WITH PROPOFOL;  Surgeon: Manya Silvas, MD;  Location: [facility] ENDOSCOPY;  Service: Endoscopy;  Laterality: N/A;  . ESOPHAGOGASTRODUODENOSCOPY (EGD) WITH PROPOFOL N/A 10/21/2020   Procedure: ESOPHAGOGASTRODUODENOSCOPY (EGD) WITH PROPOFOL;  Surgeon: Lin Landsman, MD;  Location[facility] ENDOSCOPY;  Service: Gastroenterology;  Laterality: N/A;  . LAPAROSCOPIC OOPHERECTOMY Left 05/15/2001  . LYSIS OF ADHESION  05/15/2001   LAPARSCOPIC  . SALPINGECTOMY Left 05/15/2001   LAPROSCOPIC  . SEPTOSTOMY  08/17/2006  . TONSILLECTOMY  09/27/1991    Prior to Admission medications   Medication Sig Start Date End Date Taking? Authorizing Provider  albuterol (VENTOLIN HFA) 108 (90 Base) MCG/ACT inhaler Inhale into the lungs. 11/02/17   [provider]  ALPRAZolam Duanne Moron) 0.25 MG tablet alprazolam 0.25 mg tablet    [provider]  atorvastatin (LIPITOR) 10 MG tablet Take 1 tablet by mouth daily. 07/14/20   [provider]  GE100 BLOOD GLUCOSE TEST  test strip USE TO TEST THREE TIMES DAILY 09/21/20   [provider]  glipiZIDE (GLIPIZIDE XL) 10 MG 24 hr tablet Take 2 tablets (20 mg total) by mouth daily with breakfast. 01/07/16   Lada, Satira Anis, MD  insulin NPH-regular Human (70-30) 100 UNIT/ML injection Inject into the skin. 03/30/20 03/30/21  [provider]   Insulin Syringe-Needle U-100 (INSULIN SYRINGE 1CC/31GX5/16") 31G X 5/16" 1 ML MISC See admin instructions. 03/30/20 03/30/21  [provider]  meclizine (ANTIVERT) 25 MG tablet Take 1 tablet (25 mg total) by mouth 3 (three) times daily as needed for dizziness. 06/29/20   Vladimir Crofts, MD  metFORMIN (GLUCOPHAGE-XR) 500 MG 24 hr tablet Take 1 tablet (500 mg total) by mouth every evening. appt and labs needed for further refills please 04/28/16   Arnetha Courser, MD  omeprazole (PRILOSEC) 40 MG capsule Take 1 capsule (40 mg total) by mouth 2 (two) times daily before a meal. 10/21/20 11/20/20  Vanga, Tally Due, MD  valACYclovir (VALTREX) 500 MG tablet valacyclovir 500 mg tablet    [provider]    Allergies Clindamycin/lincomycin, Amoxicillin-pot clavulanate, Cefuroxime axetil, and Pravastatin  Family History  Problem Relation Age of Onset  . Hyperlipidemia Mother   . Hypertension Mother   . Diabetes Mother   . Hyperlipidemia Father   . Hypertension Father   . Breast cancer Neg Hx     Social History Social History   Tobacco Use  . Smoking status: Never Smoker  . Smokeless tobacco: Never Used  Vaping Use  . Vaping Use: Never used  Substance Use Topics  . Alcohol use: Yes    Alcohol/week: 0.0 standard drinks    Comment: occasionally  . Drug use: No    Review of Systems  Constitutional: No fever/chills Eyes: No visual changes. ENT: No sore throat. Cardiovascular: Denies chest pain. Respiratory: Denies shortness of breath. Gastrointestinal: No abdominal pain.  No nausea, no vomiting.  No diarrhea.  No constipation. Genitourinary: Negative for dysuria. Musculoskeletal: Negative for back pain. Skin: Negative for rash. Neurological: Negative for headaches, focal weakness or numbness. ____________________________________________   PHYSICAL EXAM:  VITAL SIGNS: ED Triage Vitals  Enc Vitals Group     BP 11/16/20 1229 (!) 170/77     Pulse Rate 11/16/20 1229  80     Resp 11/16/20 1229 (!) 22     Temp 11/16/20 1232 98.3 F (36.8 C)     Temp Source 11/16/20 1232 Oral     SpO2 11/16/20 1229 98 %     Weight 11/16/20 1230 253 lb (114.8 kg)     Height 11/16/20 1230 5\' 7"  (1.702 m)     Head Circumference --      Peak Flow --      Pain Score 11/16/20 1230 5     Pain Loc --      Pain Edu? --      Excl. in [place]? --     Constitutional: Alert and oriented. Well appearing and in no acute distress. Eyes: Conjunctivae are normal. PERRL. EOMI. Head: Atraumatic. Nose: No congestion/rhinnorhea. Mouth/Throat: Mucous membranes are moist.  Oropharynx non-erythematous. Neck: No stridor.   Cardiovascular: Normal rate, regular rhythm. Grossly normal heart sounds.  Good peripheral circulation. Respiratory: Normal respiratory effort.  No retractions. Lungs CTAB. Gastrointestinal: Soft and nontender. No distention. No abdominal bruits. No CVA tenderness. Musculoskeletal: No lower extremity tenderness nor edema.  No joint effusions. Neurologic:  Normal speech and language. No gross focal neurologic deficits are appreciated.  No gait instability. Skin:  Skin is warm, dry and intact. No rash noted. Psychiatric: Mood and affect are normal. Speech and behavior are normal. ____________________________________________   LABS (all labs ordered are listed, but only abnormal results are displayed)  Labs Reviewed  BASIC METABOLIC PANEL - Abnormal; Notable for the following components:      Result Value   Glucose, Bld 167 (*)    All other components within normal limits  RESP PANEL BY RT-PCR (FLU A&B, COVID) ARPGX2  CBC  TROPONIN I (HIGH SENSITIVITY)  TROPONIN I (HIGH SENSITIVITY)   ____________________________________________  EKG  NSR Vent. rate 87 BPM PR interval 126 ms QRS duration 78 ms QT/QTcB 342/411 ms P-R-T axes 55 82 11 No STEMI ____________________________________________  RADIOLOGY I, Melvenia Needles, personally viewed and evaluated  these images (plain radiographs) as part of my medical decision making, as well as reviewing the written report by the radiologist.  ED MD interpretation:  Agree with report  Official radiology report(s): DG Chest 2 View  Result Date: 11/16/2020 CLINICAL DATA:  Chest pain EXAM: CHEST - 2 VIEW COMPARISON:  Chest radiograph 06/28/2020 FINDINGS: The heart size and mediastinal contours are within normal limits. No focal airspace disease. No pleural effusion or pneumothorax. No acute osseous abnormality. IMPRESSION: No evidence of acute cardiopulmonary disease. Electronically Signed   By: Maurine Simmering   On: 11/16/2020 13:42    ____________________________________________   PROCEDURES  Procedure(s) performed (including Critical Care):  Procedures  ____________________________________________   INITIAL IMPRESSION / ASSESSMENT AND PLAN / ED COURSE  As part of my medical decision making, I reviewed the following data within the Advance reviewed WNL, EKG interpreted NSR, Radiograph reviewed NAD and Notes from prior ED visits   Differential diagnosis includes, but is not limited to, ACS, aortic dissection, pulmonary embolism, cardiac tamponade, pneumothorax, pneumonia, pericarditis, myocarditis, GI-related causes including esophagitis/gastritis, and musculoskeletal chest wall pain.    Patient ED evaluation of episodic Heart racing and shortness of breath prior to arrival.  She presents with symptoms resolved by the time of the evaluation.  She was reassured by her negative work-up including normal troponin x2, negative chest x-ray, and no EKG evidence of any malignant arrhythmia.  No indication of any acute coronary syndrome or acute MI on exam.  Symptoms may represent nonspecific chest pain, may also represent a mild panic attack.  Patient will follow with primary provider for ongoing symptoms as discussed for she should return to the ED immediately for any worsening  symptoms. ____________________________________________   FINAL CLINICAL IMPRESSION(S) / ED DIAGNOSES  Final diagnoses:  Nonspecific chest pain     ED Discharge Orders    None      *Please note:  Jessica Tran was evaluated in Emergency Department on 11/16/2020 for the symptoms described in the history of present illness. She was evaluated in the context of the global COVID-19 pandemic, which necessitated consideration that the patient might be at risk for infection with the SARS-CoV-2 virus that causes COVID-19. Institutional protocols and algorithms that pertain to the evaluation of patients at risk for COVID-19 are in a state of rapid change based on information released by regulatory bodies including the CDC and federal and state organizations. These policies and algorithms were followed during the patient's care in the ED.  Some ED evaluations and interventions may be delayed as a result of limited staffing during and the pandemic.*   Note:  This document was prepared using Dragon voice recognition  software and may include unintentional dictation errors.    Melvenia Needles, PA-C 11/16/20 2147    Arta Silence, MD 11/17/20 [phone]

## 2020-11-16 NOTE — ED Triage Notes (Signed)
Pt to ED via POV, pt states that she was at lunch and started feeling like she was going to pass out and having chest pain. Pt states that she had taken 2 Excedrin for HA and then a few minutes later started feeling bad. Pt states that she feels like her heart is fluttering and she is short of breath. Pt is tachypneic at this time and appears mildly anxious. Pt is in NAD.

## 2020-11-16 NOTE — Discharge Instructions (Signed)
Your exam, labs, EKG, and CXR are normal and reassuring at this time. There is no evidence of a serious condition or heart injury.

## 2020-11-18 ENCOUNTER — Ambulatory Visit
Admission: RE | Admit: 2020-11-18 | Discharge: 2020-11-18 | Disposition: A | Discharge status: Home / Self Care | Payer: BLUE CROSS/BLUE SHIELD | Source: Ambulatory Visit | Attending: Internal Medicine | Admitting: Internal Medicine

## 2020-11-18 ENCOUNTER — Other Ambulatory Visit: Payer: Self-pay

## 2020-11-18 DIAGNOSIS — Z1231 Encounter for screening mammogram for malignant neoplasm of breast: Secondary | ICD-10-CM | POA: Insufficient documentation

## 2020-12-10 ENCOUNTER — Other Ambulatory Visit: Payer: Self-pay | Admitting: Gastroenterology

## 2021-06-13 ENCOUNTER — Other Ambulatory Visit: Payer: Self-pay | Admitting: Gastroenterology

## 2021-06-16 ENCOUNTER — Telehealth: Payer: Self-pay

## 2021-06-16 NOTE — Telephone Encounter (Signed)
Called patient to let her know her medicine had been refilled

## 2021-06-26 ENCOUNTER — Other Ambulatory Visit: Payer: Self-pay

## 2021-06-26 ENCOUNTER — Emergency Department: Payer: Worker's Compensation

## 2021-06-26 ENCOUNTER — Emergency Department
Admission: EM | Admit: 2021-06-26 | Discharge: 2021-06-26 | Disposition: A | Discharge status: Home / Self Care | Payer: Worker's Compensation | Attending: Emergency Medicine | Admitting: Emergency Medicine

## 2021-06-26 DIAGNOSIS — E1165 Type 2 diabetes mellitus with hyperglycemia: Secondary | ICD-10-CM | POA: Insufficient documentation

## 2021-06-26 DIAGNOSIS — Z85820 Personal history of malignant melanoma of skin: Secondary | ICD-10-CM | POA: Diagnosis not present

## 2021-06-26 DIAGNOSIS — M25511 Pain in right shoulder: Secondary | ICD-10-CM | POA: Diagnosis present

## 2021-06-26 DIAGNOSIS — Z7984 Long term (current) use of oral hypoglycemic drugs: Secondary | ICD-10-CM | POA: Diagnosis not present

## 2021-06-26 LAB — CBC
HCT: 40.4 % (ref 36.0–46.0)
Hemoglobin: 13.2 g/dL (ref 12.0–15.0)
MCH: 26.9 pg (ref 26.0–34.0)
MCHC: 32.7 g/dL (ref 30.0–36.0)
MCV: 82.4 fL (ref 80.0–100.0)
Platelets: 259 10*3/uL (ref 150–400)
RBC: 4.9 MIL/uL (ref 3.87–5.11)
RDW: 13.5 % (ref 11.5–15.5)
WBC: 7.2 10*3/uL (ref 4.0–10.5)
nRBC: 0 % (ref 0.0–0.2)

## 2021-06-26 LAB — TROPONIN I (HIGH SENSITIVITY): Troponin I (High Sensitivity): 4 ng/L (ref <18)

## 2021-06-26 LAB — BASIC METABOLIC PANEL
Anion gap: 8 (ref 5–15)
BUN: 15 mg/dL (ref 6–20)
CO2: 26 mmol/L (ref 22–32)
Calcium: 9 mg/dL (ref 8.9–10.3)
Chloride: 102 mmol/L (ref 98–111)
Creatinine, Ser: 0.6 mg/dL (ref 0.44–1.00)
GFR, Estimated: >60 mL/min (ref >60)
Glucose, Bld: 200 mg/dL — ABNORMAL HIGH (ref 70–99)
Potassium: 4.7 mmol/L (ref 3.5–5.1)
Sodium: 136 mmol/L (ref 135–145)

## 2021-06-26 MED ORDER — OXYCODONE-ACETAMINOPHEN 5-325 MG PO TABS: 1.0000 | ORAL_TABLET | Freq: Three times a day (TID) | ORAL | 0 refills | Status: DC | PRN

## 2021-06-26 MED ORDER — KETOROLAC TROMETHAMINE 30 MG/ML IJ SOLN
30.0000 mg | Freq: Once | INTRAMUSCULAR | Status: AC
  Administered 2021-06-26: 30 mg via INTRAMUSCULAR
  Filled 2021-06-26: qty 1

## 2021-06-26 MED ORDER — DEXAMETHASONE SODIUM PHOSPHATE 10 MG/ML IJ SOLN
10.0000 mg | Freq: Once | INTRAMUSCULAR | Status: AC
  Administered 2021-06-26: 10 mg via INTRAMUSCULAR
  Filled 2021-06-26: qty 1

## 2021-06-26 MED ORDER — OXYCODONE-ACETAMINOPHEN 5-325 MG PO TABS
1.0000 | ORAL_TABLET | Freq: Once | ORAL | Status: AC
  Administered 2021-06-26: 1 via ORAL
  Filled 2021-06-26: qty 1

## 2021-06-26 NOTE — ED Notes (Signed)
Pt complaining of increased right shoulder and arm pain Pt has chronic pain issues with this shoulder and has had to have multiple injections, her last one was in April and she is scheduled for another soon. Pt does not remember name of medication.  Denies new injury, pain is worse with movement  Denies n/v/d and shortness of breath. denies feeling lightheaded/dizzy.

## 2021-06-26 NOTE — ED Provider Notes (Addendum)
[facility] Emergency Department Provider Note   ____________________________________________    I have reviewed the triage vital signs and the nursing notes.   HISTORY  Chief Complaint Shoulder pain     HPI Jessica Tran is a 55 y.o. female who presents with a primary complaint of right shoulder pain.  Patient reports a history of problems with her right shoulder, she has had steroid injections and in fact has a steroid injection scheduled for this week.  However she reports the pain has worsened today, she reports if she turns her neck the pain radiates down her arm as well.  She denies weakness or numbness.  She denies chest pain to me  Past Medical History:  Diagnosis Date   Anxiety    Bilateral lower extremity edema    Cancer ([place]) 2019   melanoma   Cardiac murmur    Diabetes mellitus without complication ([place])    Pt takes Metformin and glipizide   Diverticulitis of colon    Hepatic steatosis 12/22/2015   Morbid obesity ([place]) 03/17/2015   Obesity, Class II, BMI 35.0-39.9, with comorbidity (see actual BMI)     Patient Active Problem List   Diagnosis Date Noted   Chronic GERD    Epigastric pain    Impingement syndrome of right shoulder region 01/20/2020   BMI 40.0-44.9, adult ([place]) 04/27/2016   Anxiety state 04/27/2016   Hepatic steatosis 12/22/2015   Breast cancer screening 10/05/2015   History of diverticulitis 03/17/2015   Diabetes mellitus type II, uncontrolled 03/17/2015   Bilateral lower extremity edema 03/17/2015   Generalized anxiety disorder 03/17/2015   Morbid obesity ([place]) 03/17/2015    Past Surgical History:  Procedure Laterality Date   ABDOMINAL HYSTERECTOMY  12/18/2001   LAPROSCOPIC SUPRACERVICAL WITH LYSES OF ADHESIONS   ARTHROSCOPIC REPAIR ACL Right 07/10/2012   MCL [place]   CHOLECYSTECTOMY  01/01/2004   LAPROSCOPIC   COLONOSCOPY WITH PROPOFOL N/A 06/24/2016   Procedure:  COLONOSCOPY WITH PROPOFOL;  Surgeon: Manya Silvas, MD;  Location: [place];  Service: Endoscopy;  Laterality: N/A;   ESOPHAGOGASTRODUODENOSCOPY (EGD) WITH PROPOFOL N/A 10/21/2020   Procedure: ESOPHAGOGASTRODUODENOSCOPY (EGD) WITH PROPOFOL;  Surgeon: Lin Landsman, MD;  Location: [facility] ENDOSCOPY;  Service: Gastroenterology;  Laterality: N/A;   LAPAROSCOPIC OOPHERECTOMY Left 05/15/2001   LYSIS OF ADHESION  05/15/2001   LAPARSCOPIC   SALPINGECTOMY Left 05/15/2001   LAPROSCOPIC   SEPTOSTOMY  08/17/2006   TONSILLECTOMY  09/27/1991    Prior to Admission medications   Medication Sig Start Date End Date Taking? Authorizing Provider  oxyCODONE-acetaminophen (PERCOCET) 5-325 MG tablet Take 1 tablet by mouth every 8 (eight) hours as needed for severe pain. 06/26/21 06/26/22 Yes Lavonia Drafts, MD  albuterol (VENTOLIN HFA) 108 (90 Base) MCG/ACT inhaler Inhale into the lungs. 11/02/17   [provider]  ALPRAZolam Duanne Moron) 0.25 MG tablet alprazolam 0.25 mg tablet    [provider]  atorvastatin (LIPITOR) 10 MG tablet Take 1 tablet by mouth daily. 07/14/20   [provider]  GE100 BLOOD GLUCOSE TEST test strip USE TO TEST THREE TIMES DAILY 09/21/20   [provider]  glipiZIDE (GLIPIZIDE XL) 10 MG 24 hr tablet Take 2 tablets (20 mg total) by mouth daily with breakfast. 01/07/16   Lada, Satira Anis, MD  insulin NPH-regular Human (70-30) 100 UNIT/ML injection Inject into the skin. 03/30/20 03/30/21  [provider]  meclizine (ANTIVERT) 25 MG tablet Take  1 tablet (25 mg total) by mouth 3 (three) times daily as needed for dizziness. 06/29/20   Vladimir Crofts, MD  metFORMIN (GLUCOPHAGE-XR) 500 MG 24 hr tablet Take 1 tablet (500 mg total) by mouth every evening. appt and labs needed for further refills please 04/28/16   Arnetha Courser, MD  omeprazole (PRILOSEC) 40 MG capsule TAKE 1 CAPSULE BY MOUTH 2 TIMES DAILY BEFORE A MEAL 06/16/21   Vanga, Tally Due, MD   valACYclovir (VALTREX) 500 MG tablet valacyclovir 500 mg tablet    [provider]     Allergies Clindamycin/lincomycin, Amoxicillin-pot clavulanate, Cefuroxime axetil, and Pravastatin  Family History  Problem Relation Age of Onset   Hyperlipidemia Mother    Hypertension Mother    Diabetes Mother    Hyperlipidemia Father    Hypertension Father    Breast cancer Neg Hx     Social History Social History   Tobacco Use   Smoking status: Never   Smokeless tobacco: Never  Vaping Use   Vaping Use: Never used  Substance Use Topics   Alcohol use: Yes    Alcohol/week: 0.0 standard drinks    Comment: occasionally   Drug use: No    Review of Systems  Constitutional: No fever/chills Eyes: No visual changes.  ENT: No sore throat. Cardiovascular: Denies chest pain. Respiratory: Denies shortness of breath. Gastrointestinal: No abdominal pain.  No nausea, no vomiting.   Genitourinary: Negative for dysuria. Musculoskeletal: As above Skin: Negative for rash. Neurological: Negative for headaches or weakness   ____________________________________________   PHYSICAL EXAM:  VITAL SIGNS: ED Triage Vitals  Enc Vitals Group     BP 06/26/21 1051 (!) 153/78     Pulse Rate 06/26/21 1051 88     Resp 06/26/21 1051 18     Temp 06/26/21 1051 98.8 F (37.1 C)     Temp Source 06/26/21 1051 Oral     SpO2 06/26/21 1051 95 %     Weight 06/26/21 1052 114.3 kg (252 lb)     Height 06/26/21 1052 1.702 m (5\' 7" )     Head Circumference --      Peak Flow --      Pain Score 06/26/21 1052 10     Pain Loc --      Pain Edu? --      Excl. in [place]? --     Constitutional: Alert and oriented.  Eyes: Conjunctivae are normal.  Head: Atraumatic. Nose: No congestion/rhinnorhea. Mouth/Throat: Mucous membranes are moist.   Neck:  Painless ROM Cardiovascular: Normal rate, regular rhythm. Grossly normal heart sounds.  Good peripheral circulation. Respiratory: Normal respiratory effort.  No  retractions. Lungs CTAB. Gastrointestinal: Soft and nontender. No distention.  No CVA tenderness. Genitourinary: deferred Musculoskeletal: Right shoulder is tender to palpation, particularly anteriorly and posteriorly.  Range of motion is painful, but the patient rotates her head she can also cause pain in her right shoulder and down to her hand.  Warm and well perfused, strength is normal. Neurologic:  Normal speech and language. No gross focal neurologic deficits are appreciated.  Skin:  Skin is warm, dry and intact. No rash noted. Psychiatric: Mood and affect are normal. Speech and behavior are normal.  ____________________________________________   LABS (all labs ordered are listed, but only abnormal results are displayed)  Labs Reviewed  BASIC METABOLIC PANEL - Abnormal; Notable for the following components:      Result Value   Glucose, Bld 200 (*)    All other components within normal  limits  CBC  TROPONIN I (HIGH SENSITIVITY)   ____________________________________________  EKG  ED ECG REPORT I, Lavonia Drafts, the attending physician, personally viewed and interpreted this ECG.  Date: 06/26/2021  Rhythm: normal sinus rhythm QRS Axis: normal Intervals: normal ST/T Wave abnormalities: normal Narrative Interpretation: no evidence of acute ischemia  ____________________________________________  RADIOLOGY  Chest x-ray reviewed by me, no acute abnormality ____________________________________________   PROCEDURES  Procedure(s) performed: No  Procedures   Critical Care performed: No ____________________________________________   INITIAL IMPRESSION / ASSESSMENT AND PLAN / ED COURSE  Pertinent labs & imaging results that were available during my care of the patient were reviewed by me and considered in my medical decision making (see chart for details).   Patient presents with right shoulder pain as detailed above, this is a longstanding problem for her but has  gotten worse.  Treated with IM Toradol, p.o. Percocet with some improvement.  Clearly seems musculoskeletal, EKG is reassuring, high sensitive troponin is normal, lab work is otherwise unremarkable.  Chest x-ray without abnormality  She is diabetic so hesitant to give steroids however she agreed and notes that she can take extra insulin as needed so we did give IM Decadron which seemed to help somewhat.  Patient has outpatient follow-up for steroid injection with her orthopedist, return precautions discussed, analgesics provided    ____________________________________________   FINAL CLINICAL IMPRESSION(S) / ED DIAGNOSES  Final diagnoses:  Acute pain of right shoulder        Note:  This document was prepared using Dragon voice recognition software and may include unintentional dictation errors.    Lavonia Drafts, MD 06/26/21 Pauletta Browns    Lavonia Drafts, MD 06/26/21 [phone]

## 2021-06-26 NOTE — ED Triage Notes (Signed)
Pt states she is having severe pain in her R shoulder that radiates down into her chest- pt states that the pain started yesterday- pt states she has had issues with her shoulder before but this is the worst it's ever been- pt did take 800mg  ibuprofen this AM that did not help at all- pt denies any recent injury to the shoulder

## 2021-07-02 ENCOUNTER — Other Ambulatory Visit: Payer: Self-pay | Admitting: Physical Medicine & Rehabilitation

## 2021-07-02 DIAGNOSIS — M542 Cervicalgia: Secondary | ICD-10-CM

## 2021-07-05 ENCOUNTER — Ambulatory Visit
Admission: RE | Admit: 2021-07-05 | Discharge: 2021-07-05 | Disposition: A | Discharge status: Home / Self Care | Payer: BC Managed Care – PPO | Source: Ambulatory Visit | Attending: Physical Medicine & Rehabilitation | Admitting: Physical Medicine & Rehabilitation

## 2021-07-05 ENCOUNTER — Other Ambulatory Visit: Payer: BC Managed Care – PPO

## 2021-07-05 DIAGNOSIS — M542 Cervicalgia: Secondary | ICD-10-CM

## 2021-08-02 ENCOUNTER — Other Ambulatory Visit: Payer: Self-pay | Admitting: Internal Medicine

## 2021-08-02 DIAGNOSIS — R222 Localized swelling, mass and lump, trunk: Secondary | ICD-10-CM

## 2021-08-10 ENCOUNTER — Other Ambulatory Visit: Payer: BC Managed Care – PPO

## 2021-08-12 ENCOUNTER — Other Ambulatory Visit: Payer: Self-pay

## 2021-08-12 ENCOUNTER — Ambulatory Visit
Admission: RE | Admit: 2021-08-12 | Discharge: 2021-08-12 | Disposition: A | Discharge status: Home / Self Care | Payer: BC Managed Care – PPO | Source: Ambulatory Visit | Attending: Internal Medicine | Admitting: Internal Medicine

## 2021-08-12 DIAGNOSIS — R222 Localized swelling, mass and lump, trunk: Secondary | ICD-10-CM

## 2021-08-12 MED ORDER — IOPAMIDOL (ISOVUE-300) INJECTION 61%
75.0000 mL | Freq: Once | INTRAVENOUS | Status: AC | PRN
  Administered 2021-08-12: 75 mL via INTRAVENOUS

## 2021-08-17 ENCOUNTER — Other Ambulatory Visit: Payer: BC Managed Care – PPO

## 2021-10-03 ENCOUNTER — Emergency Department
Admission: EM | Admit: 2021-10-03 | Discharge: 2021-10-03 | Disposition: A | Discharge status: Home / Self Care | Payer: BC Managed Care – PPO | Attending: Emergency Medicine | Admitting: Emergency Medicine

## 2021-10-03 ENCOUNTER — Emergency Department: Payer: BC Managed Care – PPO

## 2021-10-03 ENCOUNTER — Other Ambulatory Visit: Payer: Self-pay

## 2021-10-03 DIAGNOSIS — D72829 Elevated white blood cell count, unspecified: Secondary | ICD-10-CM | POA: Insufficient documentation

## 2021-10-03 DIAGNOSIS — E119 Type 2 diabetes mellitus without complications: Secondary | ICD-10-CM | POA: Diagnosis not present

## 2021-10-03 DIAGNOSIS — N200 Calculus of kidney: Secondary | ICD-10-CM

## 2021-10-03 DIAGNOSIS — R109 Unspecified abdominal pain: Secondary | ICD-10-CM | POA: Diagnosis present

## 2021-10-03 DIAGNOSIS — N202 Calculus of kidney with calculus of ureter: Secondary | ICD-10-CM | POA: Insufficient documentation

## 2021-10-03 LAB — CBC WITH DIFFERENTIAL/PLATELET
Abs Immature Granulocytes: 0.07 10*3/uL (ref 0.00–0.07)
Basophils Absolute: 0 10*3/uL (ref 0.0–0.1)
Basophils Relative: 0 %
Eosinophils Absolute: 0.1 10*3/uL (ref 0.0–0.5)
Eosinophils Relative: 1 %
HCT: 42.4 % (ref 36.0–46.0)
Hemoglobin: 13.6 g/dL (ref 12.0–15.0)
Immature Granulocytes: 1 %
Lymphocytes Relative: 18 %
Lymphs Abs: 2.2 10*3/uL (ref 0.7–4.0)
MCH: 26.4 pg (ref 26.0–34.0)
MCHC: 32.1 g/dL (ref 30.0–36.0)
MCV: 82.2 fL (ref 80.0–100.0)
Monocytes Absolute: 0.4 10*3/uL (ref 0.1–1.0)
Monocytes Relative: 3 %
Neutro Abs: 9.6 10*3/uL — ABNORMAL HIGH (ref 1.7–7.7)
Neutrophils Relative %: 77 %
Platelets: 319 10*3/uL (ref 150–400)
RBC: 5.16 MIL/uL — ABNORMAL HIGH (ref 3.87–5.11)
RDW: 13.2 % (ref 11.5–15.5)
WBC: 12.4 10*3/uL — ABNORMAL HIGH (ref 4.0–10.5)
nRBC: 0 % (ref 0.0–0.2)

## 2021-10-03 LAB — URINALYSIS, COMPLETE (UACMP) WITH MICROSCOPIC
Bacteria, UA: NONE SEEN
Bilirubin Urine: NEGATIVE
Glucose, UA: NEGATIVE mg/dL
Hgb urine dipstick: NEGATIVE
Ketones, ur: 20 mg/dL — AB
Leukocytes,Ua: NEGATIVE
Nitrite: NEGATIVE
Protein, ur: NEGATIVE mg/dL
Specific Gravity, Urine: 1.026 (ref 1.005–1.030)
Squamous Epithelial / HPF: NONE SEEN (ref 0–5)
WBC, UA: NONE SEEN WBC/hpf (ref 0–5)
pH: 5 (ref 5.0–8.0)

## 2021-10-03 LAB — BASIC METABOLIC PANEL
Anion gap: 8 (ref 5–15)
BUN: 11 mg/dL (ref 6–20)
CO2: 27 mmol/L (ref 22–32)
Calcium: 8.7 mg/dL — ABNORMAL LOW (ref 8.9–10.3)
Chloride: 105 mmol/L (ref 98–111)
Creatinine, Ser: 0.86 mg/dL (ref 0.44–1.00)
GFR, Estimated: >60 mL/min (ref >60)
Glucose, Bld: 187 mg/dL — ABNORMAL HIGH (ref 70–99)
Potassium: 4 mmol/L (ref 3.5–5.1)
Sodium: 140 mmol/L (ref 135–145)

## 2021-10-03 MED ORDER — ONDANSETRON HCL 4 MG/2ML IJ SOLN
4.0000 mg | Freq: Once | INTRAMUSCULAR | Status: AC
  Administered 2021-10-03: 4 mg via INTRAVENOUS
  Filled 2021-10-03: qty 2

## 2021-10-03 MED ORDER — SODIUM CHLORIDE 0.9 % IV BOLUS
1000.0000 mL | Freq: Once | INTRAVENOUS | Status: AC
  Administered 2021-10-03: 1000 mL via INTRAVENOUS

## 2021-10-03 MED ORDER — KETOROLAC TROMETHAMINE 10 MG PO TABS: 10.0000 mg | ORAL_TABLET | Freq: Three times a day (TID) | ORAL | 0 refills | Status: DC | PRN

## 2021-10-03 MED ORDER — TAMSULOSIN HCL 0.4 MG PO CAPS: 0.4000 mg | ORAL_CAPSULE | Freq: Every day | ORAL | 0 refills | Status: DC

## 2021-10-03 MED ORDER — ONDANSETRON HCL 4 MG PO TABS: 4.0000 mg | ORAL_TABLET | Freq: Three times a day (TID) | ORAL | 0 refills | Status: AC | PRN

## 2021-10-03 MED ORDER — KETOROLAC TROMETHAMINE 30 MG/ML IJ SOLN
30.0000 mg | Freq: Once | INTRAMUSCULAR | Status: AC
  Administered 2021-10-03: 30 mg via INTRAVENOUS
  Filled 2021-10-03: qty 1

## 2021-10-03 MED ORDER — MORPHINE SULFATE (PF) 4 MG/ML IV SOLN
4.0000 mg | Freq: Once | INTRAVENOUS | Status: AC
  Administered 2021-10-03: 4 mg via INTRAVENOUS
  Filled 2021-10-03: qty 1

## 2021-10-03 NOTE — ED Notes (Signed)
Assisted pt to toilet 

## 2021-10-03 NOTE — ED Notes (Signed)
Helped pt to toilet. 

## 2021-10-03 NOTE — ED Triage Notes (Signed)
Pt arrived via POV with reports of several days of flank pain, reports feeling like UTI coming on for several days, reports vomiting. ? ?No hx of kidney stones. Pt has hx of diverticulitis.  ?

## 2021-10-03 NOTE — Discharge Instructions (Addendum)
Please seek medical attention for any high fevers, chest pain, shortness of breath, change in behavior, persistent vomiting, bloody stool or any other new or concerning symptoms.  

## 2021-10-03 NOTE — ED Provider Notes (Signed)
? ?[facility] ?Provider Note ? ? ? Event Date/Time  ? First MD Initiated Contact with Patient 10/03/21 2032   ?  (approximate) ? ? ?History  ? ?Flank Pain ? ? ?HPI ? ?Jessica Tran is a 56 y.o. female  who, per internal medicine clinic note dated 07/30/21 has history of diabetes, HLD, who presents to the emergency department today because of concern for left flank pain.  Patient states that over the past few days she felt like she had a urinary tract infection starting.  She was having some dysuria and increased frequency of urination.  Patient states today she started developing a left flank pain.  The pain is severe.  It has been associated with nausea and multiple episodes of vomiting.  Patient denies any fevers.  Denies history of severe UTIs or kidney stones. ? ?  ? ? ?Physical Exam  ? ?Triage Vital Signs: ?ED Triage Vitals  ?Enc Vitals Group  ?   BP 10/03/21 2005 (!) 159/72  ?   Pulse Rate 10/03/21 2005 90  ?   Resp 10/03/21 2005 20  ?   Temp 10/03/21 2005 97.6 ?F (36.4 ?C)  ?   Temp Source 10/03/21 2005 Oral  ?   SpO2 10/03/21 2005 98 %  ?   Weight 10/03/21 2004 259 lb (117.5 kg)  ?   Height 10/03/21 2004 '5\' 7"'$  (1.702 m)  ?   Head Circumference --   ?   Peak Flow --   ?   Pain Score 10/03/21 2001 10  ?   Pain Loc --   ?   Pain Edu? --   ?   Excl. in [place]? --   ? ? ?Most recent vital signs: ?Vitals:  ? 10/03/21 2005  ?BP: (!) 159/72  ?Pulse: 90  ?Resp: 20  ?Temp: 97.6 ?F (36.4 ?C)  ?SpO2: 98%  ? ?General: Awake, appears uncomfortable ?CV:  Good peripheral perfusion. Regular rate and rhythm. ?Resp:  Normal effort. Lungs clear to auscultation ?Abd:  No distention. No CVA tenderness. ? ? ?ED Results / Procedures / Treatments  ? ?Labs ?(all labs ordered are listed, but only abnormal results are displayed) ?Labs Reviewed  ?CBC WITH DIFFERENTIAL/PLATELET - Abnormal; Notable for the following components:  ?    Result Value  ? WBC 12.4 (*)   ? RBC 5.16 (*)   ? Neutro Abs 9.6 (*)   ? All other  components within normal limits  ?URINALYSIS, COMPLETE (UACMP) WITH MICROSCOPIC - Abnormal; Notable for the following components:  ? Color, Urine YELLOW (*)   ? APPearance CLOUDY (*)   ? Ketones, ur 20 (*)   ? All other components within normal limits  ?BASIC METABOLIC PANEL - Abnormal; Notable for the following components:  ? Glucose, Bld 187 (*)   ? Calcium 8.7 (*)   ? All other components within normal limits  ? ? ? ?EKG ? ?None ? ?RADIOLOGY ?I independently interpreted and visualized the ct renal. My interpretation: No free air ?Radiology interpretation: IMPRESSION: ?1. Minimally obstructive 3 mm left ureterovesicular junction stone. ?Correlate with urinalysis to evaluate for superimposed infection. ?2. Scattered colonic diverticulosis with no acute diverticulitis.  ? ? ?PROCEDURES: ? ?Critical Care performed: No ? ?Procedures ? ? ?MEDICATIONS ORDERED IN ED: ?Medications  ?sodium chloride 0.9 % bolus 1,000 mL (has no administration in time range)  ?morphine (PF) 4 MG/ML injection 4 mg (has no administration in time range)  ?ondansetron (ZOFRAN) injection 4 mg (has no  administration in time range)  ? ? ? ?IMPRESSION / MDM / ASSESSMENT AND PLAN / ED COURSE  ?I reviewed the triage vital signs and the nursing notes. ?             ?               ? ?Differential diagnosis includes, but is not limited to, kidney stone, UTI, pyelonephritis, diverticulitis. ? ?Patient presented to the emergency department today because of concerns for left sided flank pain.  On exam patient without any CVA tenderness.  Patient was afebrile.  Blood work showed very minimal leukocytosis.  No elevation of the creatinine.  Urine without findings concerning for urinary tract infection.  CT renal stone study was obtained which did show a 3 mm stone at the left UVJ.  I discussed this with the patient.  Patient did feel better after IV medications.  Given improvement of patient's clinical symptoms I do not feel patient necessitates inpatient  mission at this time.  We will plan on discharging to follow-up.  Patient states she has primary care appointment already scheduled for tomorrow. ? ?FINAL CLINICAL IMPRESSION(S) / ED DIAGNOSES  ? ?Final diagnoses:  ?Flank pain  ?Kidney stone  ? ? ? ?Rx / DC Orders  ? ?ED Discharge Orders   ? ?      Ordered  ?  ketorolac (TORADOL) 10 MG tablet  Every 8 hours PRN       ? 10/03/21 2242  ?  ondansetron (ZOFRAN) 4 MG tablet  Every 8 hours PRN       ? 10/03/21 2242  ?  tamsulosin (FLOMAX) 0.4 MG CAPS capsule  Daily       ? 10/03/21 2242  ? ?  ?  ? ?  ? ? ? ?Note:  This document was prepared using Dragon voice recognition software and may include unintentional dictation errors. ? ?  ?Nance Pear, MD ?10/03/21 2315 ? ?

## 2021-10-29 ENCOUNTER — Other Ambulatory Visit: Payer: Self-pay | Admitting: Internal Medicine

## 2021-10-29 DIAGNOSIS — Z1231 Encounter for screening mammogram for malignant neoplasm of breast: Secondary | ICD-10-CM

## 2021-11-26 ENCOUNTER — Ambulatory Visit
Admission: RE | Admit: 2021-11-26 | Discharge: 2021-11-26 | Disposition: A | Discharge status: Home / Self Care | Payer: BC Managed Care – PPO | Source: Ambulatory Visit | Attending: Internal Medicine | Admitting: Internal Medicine

## 2021-11-26 DIAGNOSIS — Z1231 Encounter for screening mammogram for malignant neoplasm of breast: Secondary | ICD-10-CM | POA: Diagnosis present

## 2022-01-06 ENCOUNTER — Other Ambulatory Visit: Payer: Self-pay | Admitting: Sports Medicine

## 2022-01-06 DIAGNOSIS — M7551 Bursitis of right shoulder: Secondary | ICD-10-CM

## 2022-01-06 DIAGNOSIS — M79601 Pain in right arm: Secondary | ICD-10-CM

## 2022-01-06 DIAGNOSIS — G8929 Other chronic pain: Secondary | ICD-10-CM

## 2022-01-06 DIAGNOSIS — M7541 Impingement syndrome of right shoulder: Secondary | ICD-10-CM

## 2022-01-21 ENCOUNTER — Ambulatory Visit
Admission: RE | Admit: 2022-01-21 | Discharge: 2022-01-21 | Disposition: A | Discharge status: Home / Self Care | Payer: BC Managed Care – PPO | Source: Ambulatory Visit | Attending: Sports Medicine | Admitting: Sports Medicine

## 2022-01-21 ENCOUNTER — Ambulatory Visit
Admission: RE | Admit: 2022-01-21 | Discharge: 2022-01-21 | Disposition: A | Discharge status: Home / Self Care | Payer: Self-pay | Source: Ambulatory Visit | Attending: Sports Medicine | Admitting: Sports Medicine

## 2022-01-21 DIAGNOSIS — M7551 Bursitis of right shoulder: Secondary | ICD-10-CM

## 2022-01-21 DIAGNOSIS — M79601 Pain in right arm: Secondary | ICD-10-CM

## 2022-01-21 DIAGNOSIS — M7541 Impingement syndrome of right shoulder: Secondary | ICD-10-CM

## 2022-01-21 DIAGNOSIS — G8929 Other chronic pain: Secondary | ICD-10-CM

## 2022-01-21 MED ORDER — IOPAMIDOL (ISOVUE-M 300) INJECTION 61%
15.0000 mL | Freq: Once | INTRAMUSCULAR | Status: AC | PRN
  Administered 2022-01-21: 15 mL via INTRA_ARTICULAR

## 2022-02-11 ENCOUNTER — Other Ambulatory Visit: Payer: Self-pay | Admitting: Gastroenterology

## 2022-02-21 ENCOUNTER — Other Ambulatory Visit: Payer: Self-pay | Admitting: Orthopedic Surgery

## 2022-03-09 ENCOUNTER — Encounter: Payer: Self-pay | Admitting: Orthopedic Surgery

## 2022-03-10 ENCOUNTER — Encounter: Payer: Self-pay | Admitting: Orthopedic Surgery

## 2022-03-16 ENCOUNTER — Other Ambulatory Visit: Payer: Self-pay | Admitting: Gastroenterology

## 2022-03-16 NOTE — Telephone Encounter (Signed)
Need appointment sent mychart message

## 2022-03-17 NOTE — Anesthesia Preprocedure Evaluation (Addendum)
Anesthesia Evaluation  Patient identified by MRN, date of birth, ID band Patient awake    Reviewed: Allergy & Precautions, H&P , NPO status , Patient's Chart, lab work & pertinent test results  History of Anesthesia Complications Negative for: history of anesthetic complications  Airway Mallampati: III  TM Distance: <3 FB Neck ROM: full    Dental no notable dental hx.    Pulmonary neg pulmonary ROS, neg shortness of breath,    Pulmonary exam normal        Cardiovascular Exercise Tolerance: Good (-) angina(-) Past MI Normal cardiovascular exam+ Valvular Problems/Murmurs      Neuro/Psych PSYCHIATRIC DISORDERS Anxiety negative neurological ROS     GI/Hepatic Neg liver ROS, GERD  Controlled and Medicated,  Endo/Other  diabetes, Type 2, Oral Hypoglycemic Agents  Renal/GU negative Renal ROS  negative genitourinary   Musculoskeletal   Abdominal Normal abdominal exam  (+)   Peds  Hematology negative hematology ROS (+)   Anesthesia Other Findings Past Medical History: No date: Anxiety No date: Bilateral lower extremity edema 2019: Cancer ([place])     Comment:  melanoma No date: Cardiac murmur No date: Diabetes mellitus without complication (HCC)     Comment:  Pt takes Metformin and glipizide No date: Diverticulitis of colon 12/22/2015: Hepatic steatosis 03/17/2015: Morbid obesity ([place]) No date: Obesity, Class II, BMI 35.0-39.9, with comorbidity (see  actual BMI)  Past Surgical History: 12/18/2001: ABDOMINAL HYSTERECTOMY     Comment:  LAPROSCOPIC SUPRACERVICAL WITH LYSES OF ADHESIONS 07/10/2012: ARTHROSCOPIC REPAIR ACL; Right     Comment:  MCL TEAR 1995 1995: BARTHOLIN GLAND CYST EXCISION 01/01/2004: CHOLECYSTECTOMY     Comment:  LAPROSCOPIC 06/24/2016: COLONOSCOPY WITH PROPOFOL; N/A     Comment:  Procedure: COLONOSCOPY WITH PROPOFOL;  Surgeon: Manya Silvas, MD;  Location: [facility] END[facility]COPY;  Service:                Endoscopy;  Laterality: N/A; 05/15/2001: LAPAROSCOPIC OOPHERECTOMY; Left 05/15/2001: LYSIS OF ADHESION     Comment:  LAPARSCOPIC 05/15/2001: SALPINGECTOMY; Left     Comment:  LAPROSCOPIC 08/17/2006: SEPTOSTOMY 09/27/1991: TONSILLECTOMY  BMI    Body Mass Index: 40.64 kg/m      Reproductive/Obstetrics negative OB ROS                            Anesthesia Physical  Anesthesia Plan  ASA: 2  Anesthesia Plan: General   Post-op Pain Management: Regional block   Induction: Intravenous  PONV Risk Score and Plan: Ondansetron and Dexamethasone  Airway Management Planned: Oral ETT  Additional Equipment:   Intra-op Plan:   Post-operative Plan: Extubation in OR  Informed Consent: I have reviewed the patients History and Physical, chart, labs and discussed the procedure including the risks, benefits and alternatives for the proposed anesthesia with the patient or authorized representative who has indicated his/her understanding and acceptance.     Dental Advisory Given  Plan Discussed with: Anesthesiologist, CRNA and Surgeon  Anesthesia Plan Comments: (Patient consented for risks of anesthesia including but not limited to:  - adverse reactions to medications - risk of airway placement if required - damage to eyes, teeth, lips or other oral mucosa - nerve damage due to positioning  - sore throat or hoarseness - Damage to heart, brain, nerves, lungs, other parts of body or loss of life  Patient voiced understanding.)  Anesthesia Quick Evaluation  

## 2022-03-18 ENCOUNTER — Ambulatory Visit
Admission: RE | Admit: 2022-03-18 | Discharge: 2022-03-18 | Disposition: A | Discharge status: Home / Self Care | Payer: Worker's Compensation | Attending: Orthopedic Surgery | Admitting: Orthopedic Surgery

## 2022-03-18 ENCOUNTER — Encounter: Payer: Self-pay | Admitting: Orthopedic Surgery

## 2022-03-18 ENCOUNTER — Other Ambulatory Visit: Payer: Self-pay

## 2022-03-18 ENCOUNTER — Ambulatory Visit: Payer: Worker's Compensation | Admitting: Anesthesiology

## 2022-03-18 ENCOUNTER — Ambulatory Visit (AMBULATORY_SURGERY_CENTER): Payer: Worker's Compensation | Admitting: Anesthesiology

## 2022-03-18 ENCOUNTER — Encounter
Admission: RE | Disposition: A | Discharge status: Home / Self Care | Payer: Self-pay | Source: Home / Self Care | Attending: Orthopedic Surgery

## 2022-03-18 DIAGNOSIS — Z794 Long term (current) use of insulin: Secondary | ICD-10-CM | POA: Insufficient documentation

## 2022-03-18 DIAGNOSIS — M7521 Bicipital tendinitis, right shoulder: Secondary | ICD-10-CM | POA: Insufficient documentation

## 2022-03-18 DIAGNOSIS — S46011A Strain of muscle(s) and tendon(s) of the rotator cuff of right shoulder, initial encounter: Secondary | ICD-10-CM | POA: Insufficient documentation

## 2022-03-18 DIAGNOSIS — M19011 Primary osteoarthritis, right shoulder: Secondary | ICD-10-CM | POA: Diagnosis not present

## 2022-03-18 DIAGNOSIS — M25811 Other specified joint disorders, right shoulder: Secondary | ICD-10-CM | POA: Diagnosis present

## 2022-03-18 DIAGNOSIS — E119 Type 2 diabetes mellitus without complications: Secondary | ICD-10-CM | POA: Diagnosis not present

## 2022-03-18 DIAGNOSIS — K219 Gastro-esophageal reflux disease without esophagitis: Secondary | ICD-10-CM | POA: Diagnosis not present

## 2022-03-18 DIAGNOSIS — Y990 Civilian activity done for income or pay: Secondary | ICD-10-CM | POA: Diagnosis not present

## 2022-03-18 DIAGNOSIS — X58XXXA Exposure to other specified factors, initial encounter: Secondary | ICD-10-CM | POA: Diagnosis not present

## 2022-03-18 DIAGNOSIS — M75101 Unspecified rotator cuff tear or rupture of right shoulder, not specified as traumatic: Secondary | ICD-10-CM

## 2022-03-18 DIAGNOSIS — M7541 Impingement syndrome of right shoulder: Secondary | ICD-10-CM | POA: Diagnosis not present

## 2022-03-18 HISTORY — PX: SHOULDER ARTHROSCOPY WITH OPEN ROTATOR CUFF REPAIR: SHX6092

## 2022-03-18 HISTORY — DX: Gastro-esophageal reflux disease without esophagitis: K21.9

## 2022-03-18 HISTORY — DX: Unspecified osteoarthritis, unspecified site: M19.90

## 2022-03-18 LAB — GLUCOSE, CAPILLARY
Glucose-Capillary: 146 mg/dL — ABNORMAL HIGH (ref 70–99)
Glucose-Capillary: 194 mg/dL — ABNORMAL HIGH (ref 70–99)

## 2022-03-18 SURGERY — ARTHROSCOPY, SHOULDER WITH REPAIR, ROTATOR CUFF, OPEN
Anesthesia: General | Site: Shoulder | Laterality: Right

## 2022-03-18 MED ORDER — CEFAZOLIN SODIUM-DEXTROSE 2-4 GM/100ML-% IV SOLN
2.0000 g | INTRAVENOUS | Status: AC
  Administered 2022-03-18: 2 g via INTRAVENOUS

## 2022-03-18 MED ORDER — BUPIVACAINE HCL (PF) 0.5 % IJ SOLN
INTRAMUSCULAR | Status: DC | PRN
  Administered 2022-03-18: 10 mL via PERINEURAL

## 2022-03-18 MED ORDER — ONDANSETRON 4 MG PO TBDP: 4.0000 mg | ORAL_TABLET | Freq: Three times a day (TID) | ORAL | 0 refills | Status: DC | PRN

## 2022-03-18 MED ORDER — LIDOCAINE HCL 1 % IJ SOLN
INTRAMUSCULAR | Status: DC | PRN
  Administered 2022-03-18: 2 mL

## 2022-03-18 MED ORDER — DEXAMETHASONE SODIUM PHOSPHATE 4 MG/ML IJ SOLN
INTRAMUSCULAR | Status: DC | PRN
  Administered 2022-03-18: 8 mg via INTRAVENOUS

## 2022-03-18 MED ORDER — GLYCOPYRROLATE 0.2 MG/ML IJ SOLN
INTRAMUSCULAR | Status: DC | PRN
  Administered 2022-03-18: .2 mg via INTRAVENOUS

## 2022-03-18 MED ORDER — BUPIVACAINE LIPOSOME 1.3 % IJ SUSP
INTRAMUSCULAR | Status: DC | PRN
  Administered 2022-03-18: 10 mL via PERINEURAL

## 2022-03-18 MED ORDER — LACTATED RINGERS IR SOLN
Status: DC | PRN
  Administered 2022-03-18: 6000 mL

## 2022-03-18 MED ORDER — OXYCODONE HCL 5 MG PO TABS: 5.0000 mg | ORAL_TABLET | ORAL | 0 refills | Status: AC | PRN

## 2022-03-18 MED ORDER — ACETAMINOPHEN 500 MG PO TABS: 1000.0000 mg | ORAL_TABLET | Freq: Three times a day (TID) | ORAL | 2 refills | Status: AC

## 2022-03-18 MED ORDER — LACTATED RINGERS IV SOLN: INTRAVENOUS | Status: DC

## 2022-03-18 MED ORDER — LACTATED RINGERS IV SOLN
INTRAVENOUS | Status: DC | PRN
  Administered 2022-03-18: 12000 mL

## 2022-03-18 MED ORDER — MIDAZOLAM HCL 5 MG/5ML IJ SOLN
INTRAMUSCULAR | Status: DC | PRN
  Administered 2022-03-18: 2 mg via INTRAVENOUS

## 2022-03-18 MED ORDER — SUCCINYLCHOLINE CHLORIDE 200 MG/10ML IV SOSY
PREFILLED_SYRINGE | INTRAVENOUS | Status: DC | PRN
  Administered 2022-03-18: 120 mg via INTRAVENOUS

## 2022-03-18 MED ORDER — FENTANYL CITRATE (PF) 100 MCG/2ML IJ SOLN
INTRAMUSCULAR | Status: DC | PRN
  Administered 2022-03-18: 100 ug via INTRAVENOUS
  Administered 2022-03-18 (×2): 50 ug via INTRAVENOUS

## 2022-03-18 MED ORDER — ASPIRIN 325 MG PO TBEC: 325.0000 mg | DELAYED_RELEASE_TABLET | Freq: Every day | ORAL | 0 refills | Status: AC

## 2022-03-18 MED ORDER — PROPOFOL 10 MG/ML IV BOLUS
INTRAVENOUS | Status: DC | PRN
  Administered 2022-03-18: 120 ug/kg/min via INTRAVENOUS
  Administered 2022-03-18: 50 mg via INTRAVENOUS
  Administered 2022-03-18: 200 mg via INTRAVENOUS

## 2022-03-18 MED ORDER — ONDANSETRON HCL 4 MG/2ML IJ SOLN
INTRAMUSCULAR | Status: DC | PRN
  Administered 2022-03-18: 4 mg via INTRAVENOUS

## 2022-03-18 MED ORDER — LIDOCAINE HCL (CARDIAC) PF 100 MG/5ML IV SOSY
PREFILLED_SYRINGE | INTRAVENOUS | Status: DC | PRN
  Administered 2022-03-18: 100 mg via INTRATRACHEAL

## 2022-03-18 SURGICAL SUPPLY — 55 items
ADH SKN CLS APL DERMABOND .7 (GAUZE/BANDAGES/DRESSINGS) ×1
ADPR IRR PORT MULTIBAG TUBE (MISCELLANEOUS) ×2
ANCH SUT 2 SWLK 19.1 CLS EYLT (Anchor) ×1 IMPLANT
ANCH SUT 2.9 PUSHLOCK ANCH (Orthopedic Implant) ×1 IMPLANT
ANCH SUT FBRTK 1.3X2.6X1.7 SLF (Anchor) ×1 IMPLANT
ANCHOR FIBERTAK 2.6X1.7 BLUE (Anchor) IMPLANT
ANCHOR SWIVELOCK BIO 4.75X19.1 (Anchor) IMPLANT
APL PRP STRL LF DISP 70% ISPRP (MISCELLANEOUS) ×1
BLADE SHAVER 4.5X7 STR FR (MISCELLANEOUS) ×1 IMPLANT
BUR BR 5.5 WIDE MOUTH (BURR) ×1 IMPLANT
CANNULA PART THRD DISP 5.75X7 (CANNULA) ×1 IMPLANT
CANNULA PARTIAL THREAD 2X7 (CANNULA) ×1 IMPLANT
CANNULA TWIST IN 8.25X7CM (CANNULA) IMPLANT
CHLORAPREP W/TINT 26 (MISCELLANEOUS) ×1 IMPLANT
COOLER POLAR GLACIER W/PUMP (MISCELLANEOUS) ×1 IMPLANT
COVER LIGHT HANDLE UNIVERSAL (MISCELLANEOUS) ×2 IMPLANT
DERMABOND ADVANCED .7 DNX12 (GAUZE/BANDAGES/DRESSINGS) ×1 IMPLANT
DRAPE INCISE IOBAN 66X45 STRL (DRAPES) ×1 IMPLANT
DRAPE U-SHAPE 48X52 POLY STRL (PACKS) ×1 IMPLANT
DRSG TEGADERM 4X4.75 (GAUZE/BANDAGES/DRESSINGS) ×3 IMPLANT
ELECT REM PT RETURN 9FT ADLT (ELECTROSURGICAL) ×1
ELECTRODE REM PT RTRN 9FT ADLT (ELECTROSURGICAL) ×1 IMPLANT
GAUZE SPONGE 4X4 12PLY STRL (GAUZE/BANDAGES/DRESSINGS) ×1 IMPLANT
GAUZE XEROFORM 1X8 LF (GAUZE/BANDAGES/DRESSINGS) ×1 IMPLANT
GLOVE SRG 8 PF TXTR STRL LF DI (GLOVE) ×3 IMPLANT
GLOVE SURG ENC MOIS LTX SZ7.5 (GLOVE) ×5 IMPLANT
GLOVE SURG UNDER POLY LF SZ8 (GLOVE) ×3
GOWN STRL REIN 2XL XLG LVL4 (GOWN DISPOSABLE) ×1 IMPLANT
GOWN STRL REUS W/ TWL LRG LVL3 (GOWN DISPOSABLE) ×3 IMPLANT
GOWN STRL REUS W/TWL LRG LVL3 (GOWN DISPOSABLE) ×3
IV LACTATED RINGER IRRG 3000ML (IV SOLUTION) ×6
IV LR IRRIG 3000ML ARTHROMATIC (IV SOLUTION) ×6 IMPLANT
KIT STABILIZATION SHOULDER (MISCELLANEOUS) ×1 IMPLANT
KIT TURNOVER KIT A (KITS) ×1 IMPLANT
MANIFOLD 4PT FOR NEPTUNE1 (MISCELLANEOUS) ×1 IMPLANT
MASK FACE SPIDER DISP (MASK) ×1 IMPLANT
MAT ABSORB  FLUID 56X50 GRAY (MISCELLANEOUS) ×2
MAT ABSORB FLUID 56X50 GRAY (MISCELLANEOUS) ×2 IMPLANT
PACK ARTHROSCOPY SHOULDER (MISCELLANEOUS) ×1 IMPLANT
PAD ABD DERMACEA PRESS 5X9 (GAUZE/BANDAGES/DRESSINGS) ×2 IMPLANT
PAD WRAPON POLAR SHDR XLG (MISCELLANEOUS) ×1 IMPLANT
PASSER SUT FIRSTPASS SELF (INSTRUMENTS) IMPLANT
SET Y ADAPTER MULIT-BAG IRRIG (MISCELLANEOUS) ×2 IMPLANT
SUT ETHILON 3-0 (SUTURE) ×1 IMPLANT
SUT MNCRL 4-0 (SUTURE) ×1
SUT MNCRL 4-0 27XMFL (SUTURE) ×1
SUT PROLENE 0 CT 2 (SUTURE) IMPLANT
SUTURE MNCRL 4-0 27XMF (SUTURE) ×1 IMPLANT
SYSTEM IMPL TENODESIS LNT 2.9 (Orthopedic Implant) IMPLANT
TAPE MICROFOAM 4IN (TAPE) ×1 IMPLANT
TUBING CONNECTING 10 (TUBING) ×1 IMPLANT
TUBING INFLOW SET DBFLO PUMP (TUBING) ×1 IMPLANT
TUBING OUTFLOW SET DBLFO PUMP (TUBING) ×1 IMPLANT
WAND WEREWOLF FLOW 90D (MISCELLANEOUS) ×1 IMPLANT
WRAPON POLAR PAD SHDR XLG (MISCELLANEOUS) ×1

## 2022-03-18 NOTE — Op Note (Signed)
SURGERY DATE: 03/18/2022   PRE-OP DIAGNOSIS:  1. Right subacromial impingement 2. Right biceps tendinopathy 3. Right rotator cuff tear 4. Right acromioclavicular joint arthritis  POST-OP DIAGNOSIS: 1. Right subacromial impingement 2. Right biceps tendinopathy 3. Right rotator cuff tear 4. Right acromioclavicular joint arthritis  PROCEDURES:  1. Right arthroscopic rotator cuff repair 2. Right arthroscopic biceps tenodesis 3. Right arthroscopic subacromial decompression 4. Right arthroscopic extensive debridement of shoulder (glenohumeral and subacromial spaces) 5. Right arthroscopic distal clavicle excision   SURGEON: Cato Mulligan, MD   ASSISTANT: Anitra Lauth, PA   ANESTHESIA: Gen with Exparel interscalene block   ESTIMATED BLOOD LOSS: 5cc   DRAINS:  none   TOTAL IV FLUIDS: per anesthesia      SPECIMENS: none   IMPLANTS:  - Arthrex 2.100m PushLock x 1 - Arthrex 4.734mSwiveLock x 1 - Arthrex 2.50m3mnotless Anchor Double Loaded with FiberTape and SutureTape x1     OPERATIVE FINDINGS:  Examination under anesthesia: A careful examination under anesthesia was performed.  Passive range of motion was: FF: 150; ER at side: 70; ER in abduction: 110; IR in abduction: 45.  Anterior load shift: NT.  Posterior load shift: NT.  Sulcus in neutral: NT.  Sulcus in ER: NT.     Intra-operative findings: A thorough arthroscopic examination of the shoulder was performed.  The findings are: 1. Biceps tendon: tendinopathy with significant erythema  2. Superior labrum: erythema 3. Posterior labrum and capsule: normal 4. Inferior capsule and inferior recess: normal 5. Glenoid cartilage surface: Normal 6. Supraspinatus attachment: High-grade partial-thickness tear of the supraspinatus involving the articular surface and bursal surface, combining to account for approximately 80% of the footprint  7. Posterior rotator cuff attachment: normal 8. Humeral head articular cartilage: normal 9.  Rotator interval: significant synovitis 10: Subscapularis tendon: attachment intact 11. Anterior labrum: Mildly degenerative 12. IGHL: normal   OPERATIVE REPORT:    Indications for procedure:  Jessica Tran a 56 29o. female with approximately 9 months of right shoulder pain after sustaining an injury at work.  This is a [place] has had difficulty with overhead motion since that time with sensations of weakness. Clinical exam and MRI were suggestive of high right partial-thickness rotator cuff tear, biceps tendinopathy, acromioclavicular joint arthritis and subacromial impingement.  She has failed extensive nonoperative management without appropriate relief of her symptoms.  After discussion of risks, benefits, and alternatives to surgery, the patient elected to proceed.    Procedure in detail:   I identified Jessica Tran the pre-operative holding area.  I marked the operative shoulder with my initials. I reviewed the risks and benefits of the proposed surgical intervention, and the patient wished to proceed.  Anesthesia was then performed with an Exparel interscalene block.  The patient was transferred to the operative suite and placed in the beach chair position.     Appropriate IV antibiotics were administered prior to incision. The operative upper extremity was then prepped and draped in standard fashion. A time out was performed confirming the correct extremity, correct patient, and correct procedure.    I then created a standard posterior portal with an 11 blade. The glenohumeral joint was easily entered with a blunt trocar and the arthroscope introduced. The findings of diagnostic arthroscopy are described above. I debrided degenerative tissue including the synovitic tissue about the rotator interval and anterior and superior labrum. I then coagulated the inflamed synovium to obtain hemostasis and reduce the risk of post-operative  swelling using an  Arthrocare radiofrequency device.   I then turned my attention to the arthroscopic biceps tenodesis. The Loop n Tack technique was used to pass a FiberTape through the biceps in a locked fashion adjacent to the biceps anchor.  A hole for a 2.9 mm Arthrex PushLock was drilled in the bicipital groove just superior to the subscapularis tendon insertion.  The biceps tendon was then cut and the biceps anchor complex was debrided down to a stable base on the superior labrum.  The FiberTape was loaded onto the PushLock anchor and impacted into place into the previously drilled hole in the bicipital groove.  This appropriately secured the biceps into the bicipital groove and took it off of tension.   Next, the arthroscope was then introduced into the subacromial space. A direct lateral portal was created with an 11-blade after spinal needle localization. An extensive subacromial bursectomy and debridement was performed using a combination of the shaver and Arthrocare wand. The entire acromial undersurface was exposed and the CA ligament was subperiosteally elevated to expose the anterior acromial hook. A burr was used to create a flat anterior and lateral aspect of the acromion, converting it from a Type 2 to a Type 1 acromion. Care was made to keep the deltoid fascia intact.  I then turned my attention to the arthroscopic distal clavicle excision. I identified the acromioclavicular joint.   Surrounding bursal tissue was debrided and the edges of the joint were identified.There was minimal space between the distal clavicle and acromion.  I used the 5.63m barrel burr to remove the distal clavicle parallel to the edge of the acromion. I was able to fit two widths of the burr into the space between the distal clavicle and acromion, signifying that I had removed ~154mof distal clavicle. This was confirmed by viewing anteriorly and introducing a probe with measuring marks from the lateral portal. Hemostasis was achieved  with an Arthrocare wand.    Next, I created an accessory posterolateral portal to assist with visualization and instrumentation.  I could pass a switching stick with minimal force through the bursal sided rotator cuff tear into the joint.  Therefore, I finished completing the tear by debrided the poor quality edges of the supraspinatus tendon.  This was a normal U-shaped tear of the supraspinatus.    I then percutaneously placed 1 Arthrex knotless medial row anchor at the articular margin. I then shuttled all strands of tape through the rotator cuff just lateral to the musculotendinous junction using a FirstPass suture passer spanning the anterior to posterior extent of the tear. All strands of suture were passed through an [employer] Inc[employer]chor.  This was placed approximately 2 cm distal to the lateral edge of the footprint in line with the midportion of the tear with appropriate tensioning of each suture prior to final fixation. There was room to further reinforce the anterior cable.  The knotless mechanism of the SwiveLock anchor was utilized to reduce the anterior cable further. This construct allowed for excellent reapproximation of the rotator cuff to its native footprint without undue tension.  Appropriate compression was achieved. The repair was stable to external and internal rotation.   Fluid was evacuated from the shoulder, and the portals were closed with 3-0 Nylon. Xeroform was applied to the portals. A sterile dressing was applied, followed by a Polar Care sleeve and a SlingShot shoulder immobilizer/sling. The patient was awakened from anesthesia without difficulty and was transferred to the PACU in stable condition.  Of note, assistance from a PA was essential to performing the surgery.  PA was present for the entire surgery.  PA assisted with patient positioning, retraction, instrumentation, and wound closure. The surgery would have been more difficult and had longer operative time  without PA assistance.   COMPLICATIONS: none   DISPOSITION: plan for discharge home after recovery in PACU     POSTOPERATIVE PLAN: Remain in sling (except hygiene and elbow/wrist/hand RoM exercises as instructed by PT) x 4 weeks and NWB for this time. PT to begin 3-4 days after surgery.  Small/medium rotator cuff repair rehab protocol. ASA '325mg'$  daily x 2 weeks for DVT ppx.

## 2022-03-18 NOTE — H&P (Signed)
Paper H&P to be scanned into permanent record. H&P reviewed. No significant changes noted.  

## 2022-03-18 NOTE — Progress Notes (Signed)
Assisted E Johnson MDA with right, interscalene , ultrasound guided block. Side rails up, monitors on throughout procedure. See vital signs in flow sheet. Tolerated Procedure well.

## 2022-03-18 NOTE — Anesthesia Postprocedure Evaluation (Signed)
Anesthesia Post Note  Patient: Jessica Tran  Procedure(s) Performed: Right shoulder arthroscopic rotator cuff repair, subacromial decompression, distal clavicle excision, and biceps tenodesis (Right: Shoulder)     Patient location during evaluation: PACU Anesthesia Type: General Level of consciousness: awake and alert Pain management: pain level controlled Vital Signs Assessment: post-procedure vital signs reviewed and stable Respiratory status: spontaneous breathing, nonlabored ventilation and respiratory function stable Cardiovascular status: blood pressure returned to baseline and stable Postop Assessment: no apparent nausea or vomiting Anesthetic complications: no   There were no known notable events for this encounter.  Iran Ouch

## 2022-03-18 NOTE — Anesthesia Procedure Notes (Signed)
Procedure Name: Intubation Date/Time: 03/18/2022 8:07 AM  Performed by: Patience Musca., CRNAPre-anesthesia Checklist: Patient identified, Patient being monitored, Timeout performed, Emergency Drugs available and Suction available Patient Re-evaluated:Patient Re-evaluated prior to induction Oxygen Delivery Method: Circle system utilized Preoxygenation: Pre-oxygenation with 100% oxygen Induction Type: IV induction Ventilation: Mask ventilation without difficulty Laryngoscope Size: Mac and 4 Grade View: Grade I Tube type: Oral Tube size: 7.0 mm Number of attempts: 1 Airway Equipment and Method: Stylet Placement Confirmation: ETT inserted through vocal cords under direct vision, positive ETCO2 and breath sounds checked- equal and bilateral Secured at: 21 cm Tube secured with: Tape Dental Injury: Teeth and Oropharynx as per pre-operative assessment

## 2022-03-18 NOTE — Anesthesia Procedure Notes (Signed)
Anesthesia Regional Block: Interscalene brachial plexus block   Pre-Anesthetic Checklist: , timeout performed,  Correct Patient, Correct Site, Correct Laterality,  Correct Procedure, Correct Position, site marked,  Risks and benefits discussed,  Surgical consent,  Pre-op evaluation,  At surgeon's request and post-op pain management  Laterality: Upper and Right  Prep: chloraprep       Needles:  Injection technique: Single-shot  Needle Type: Stimiplex     Needle Length: 9cm  Needle Gauge: 22     Additional Needles:   Procedures:,,,, ultrasound used (permanent image in chart),,    Narrative:  Start time: 03/18/2022 7:19 AM End time: 03/18/2022 7:29 AM Injection made incrementally with aspirations every 5 mL.  Performed by: Personally  Anesthesiologist: Iran Ouch, MD  Additional Notes: Patient consented for risk and benefits of nerve block including but not limited to nerve damage, failed block, bleeding and infection.  Patient voiced understanding.  Functioning IV was confirmed and monitors were applied.  Timeout done prior to procedure and prior to any sedation being given to the patient.  Patient confirmed procedure site prior to any sedation given to the patient.  A 1m 22ga Stimuplex needle was used. Sterile prep,hand hygiene and sterile gloves were used.  Minimal sedation used for procedure.  No paresthesia endorsed by patient during the procedure.  Negative aspiration and negative test dose prior to incremental administration of local anesthetic. The patient tolerated the procedure well with no immediate complications.

## 2022-03-18 NOTE — Transfer of Care (Signed)
Immediate Anesthesia Transfer of Care Note  Patient: Jessica Tran  Procedure(s) Performed: Right shoulder arthroscopic rotator cuff repair, subacromial decompression, distal clavicle excision, and biceps tenodesis (Right: Shoulder)  Patient Location: PACU  Anesthesia Type: General  Level of Consciousness: awake, alert  and patient cooperative  Airway and Oxygen Therapy: Patient Spontanous Breathing and Patient connected to supplemental oxygen  Post-op Assessment: Post-op Vital signs reviewed, Patient's Cardiovascular Status Stable, Respiratory Function Stable, Patent Airway and No signs of Nausea or vomiting  Post-op Vital Signs: Reviewed and stable  Complications: There were no known notable events for this encounter.

## 2022-03-18 NOTE — Discharge Instructions (Signed)
Post-Op Instructions - Rotator Cuff Repair  1. Bracing: You will wear a shoulder immobilizer or sling for 4 weeks.   2. Driving: No driving for 4 weeks post-op.   3. Activity: No active lifting for 2 months. Wrist, hand, and elbow motion only. Avoid lifting the upper arm away from the body except for hygiene. You are permitted to bend and straighten the elbow passively only (no active elbow motion). You may use your hand and wrist for typing, writing, and managing utensils (cutting food). Do not lift more than a coffee cup for 8 weeks.  When sleeping or resting, inclined positions (recliner chair or wedge pillow) and a pillow under the forearm for support may provide better comfort for up to 4 weeks.  Avoid long distance travel for 4 weeks.  Return to normal activities after rotator cuff repair repair normally takes 6 months on average. If rehab goes very well, may be able to do most activities at 4 months, except overhead or contact sports.  4. Physical Therapy: Begins 3-7 days after surgery, and proceed 1 time per week for the first 6 weeks, then 1-2 times per week from weeks 6-20 post-op.  5. Medications:  - You will be provided a prescription for narcotic pain medicine. After surgery, take 1-2 narcotic tablets every 4 hours if needed for severe pain.  - A prescription for anti-nausea medication will be provided in case the narcotic medicine causes nausea - take 1 tablet every 6 hours only if nauseated.   - Take tylenol 1000 mg (2 Extra Strength tablets or 3 regular strength) every 8 hours for pain.  May decrease or stop tylenol 5 days after surgery if you are having minimal pain. - Take ASA '325mg'$ /day x 2 weeks to help prevent DVTs/PEs (blood clots).  - DO NOT take ANY nonsteroidal anti-inflammatory pain medications (Advil, Motrin, Ibuprofen, Aleve, Naproxen, or Naprosyn). These medicines can inhibit healing of your shoulder repair.    If you are taking prescription medication for anxiety,  depression, insomnia, muscle spasm, chronic pain, or for attention deficit disorder, you are advised that you are at a higher risk of adverse effects with use of narcotics post-op, including narcotic addiction/dependence, depressed breathing, death. If you use non-prescribed substances: alcohol, marijuana, cocaine, heroin, methamphetamines, etc., you are at a higher risk of adverse effects with use of narcotics post-op, including narcotic addiction/dependence, depressed breathing, death. You are advised that taking > 50 morphine milligram equivalents (MME) of narcotic pain medication per day results in twice the risk of overdose or death. For your prescription provided: oxycodone 5 mg - taking more than 6 tablets per day would result in > 50 morphine milligram equivalents (MME) of narcotic pain medication. Be advised that we will prescribe narcotics short-term, for acute post-operative pain only - 3 weeks for major operations such as shoulder repair/reconstruction surgeries.     6. Post-Op Appointment:  Your first post-op appointment will be 10-14 days post-op.  7. Work or School: For most, but not all procedures, we advise staying out of work or school for at least 1 to 2 weeks in order to recover from the stress of surgery and to allow time for healing.   If you need a work or school note this can be provided.   8. Smoking: If you are a smoker, you need to refrain from smoking in the postoperative period. The nicotine in cigarettes will inhibit healing of your shoulder repair and decrease the chance of successful repair. Similarly, nicotine containing  products (gum, patches) should be avoided.   Post-operative Brace: Apply and remove the brace you received as you were instructed to at the time of fitting and as described in detail as the brace's instructions for use indicate.  Wear the brace for the period of time prescribed by your physician.  The brace can be cleaned with soap and water and  allowed to air dry only.  Should the brace result in increased pain, decreased feeling (numbness/tingling), increased swelling or an overall worsening of your medical condition, please contact your doctor immediately.  If an emergency situation occurs as a result of wearing the brace after normal business hours, please dial 911 and seek immediate medical attention.  Let your doctor know if you have any further questions about the brace issued to you. Refer to the shoulder sling instructions for use if you have any questions regarding the correct fit of your shoulder sling.  [facility]: [phone]  Video that illustrates how to properly use a shoulder sling: "Instructions for Proper Use of an Orthopaedic Sling" [email or web address]

## 2022-03-21 ENCOUNTER — Encounter: Payer: Self-pay | Admitting: Orthopedic Surgery

## 2022-05-20 IMAGING — MR MR CERVICAL SPINE WO/W CM
5 of 8 series · 26 of 48 positions shown · IV contrast (20ml Multihance)
Comparison: Comparison made with previous MRI from 12/21/2006.

CLINICAL DATA: Initial evaluation for 1 year history of right-sided
neck and shoulder pain with weakness. No known injury.

EXAM:
MRI CERVICAL SPINE WITHOUT AND WITH CONTRAST
TECHNIQUE: Multiplanar and multiecho pulse sequences of the cervical spine, to
include the craniocervical junction and cervicothoracic junction,
were obtained without and with intravenous contrast.
CONTRAST:  20mL MULTIHANCE GADOBENATE DIMEGLUMINE 529 MG/ML IV SOLN

[Series 3: T2 · sagittal · 3.0mm · 0.66mm/px · 4 of 15 slices shown (1 of 2)]
[im 1/15]
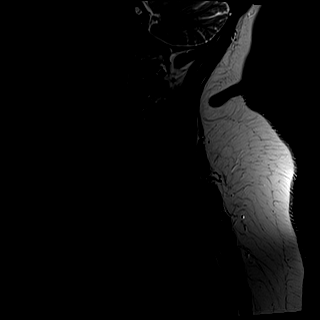
[im 5/15]
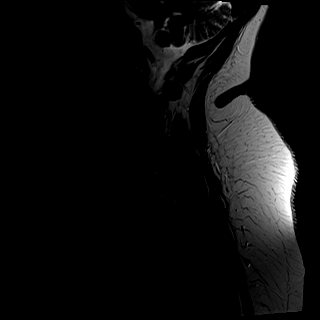
[im 10/15]
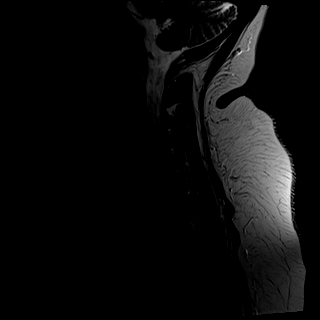
[im 15/15]
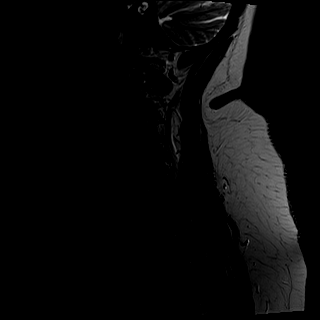

[Series 4: T1 · sagittal · 3.0mm · 0.41mm/px · 4 of 15 slices shown (1 of 3)]
[im 1/15]
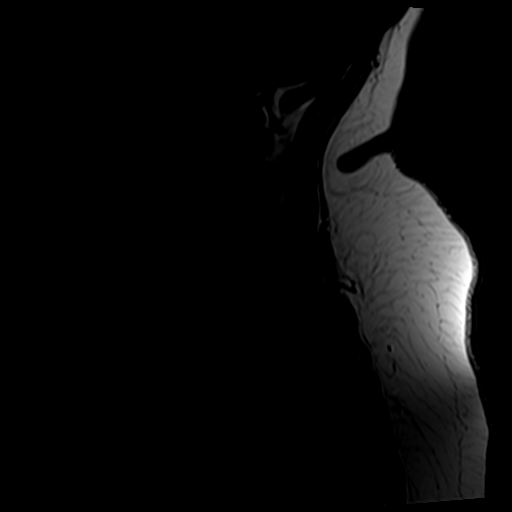
[im 5/15]
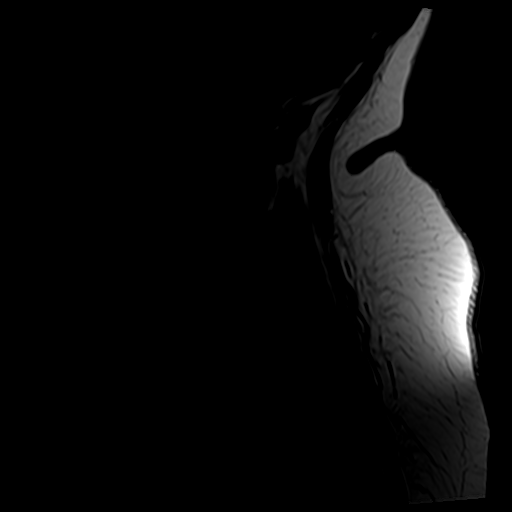
[im 10/15]
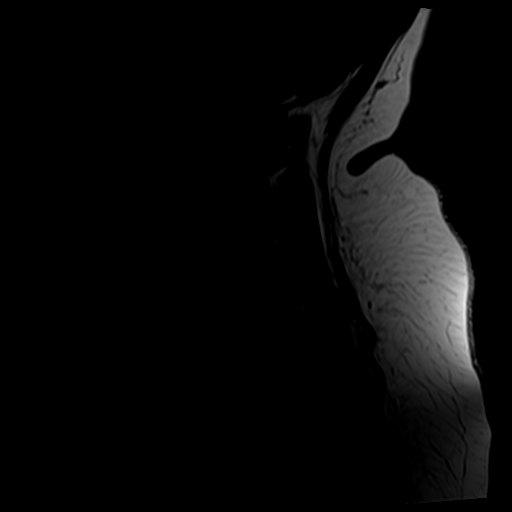
[im 15/15]
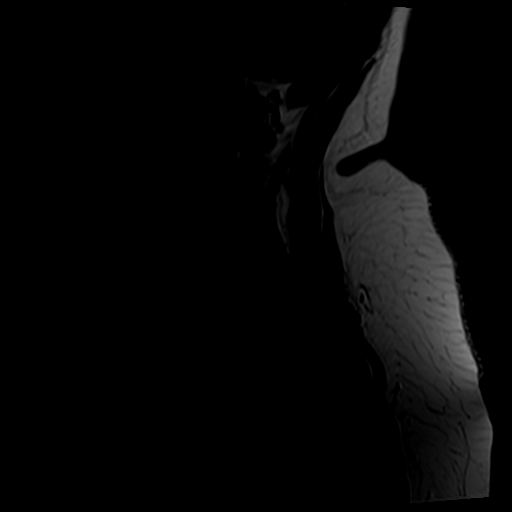

[Series 7: T2 · axial · 3.0mm · 0.70mm/px · z∈[-39,+61]mm · 8 of 28 slices shown (2 of 2)]
[im 1/28]
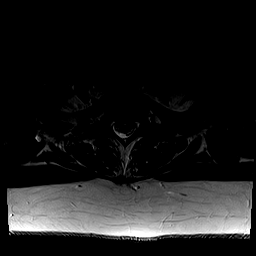
[im 4/28]
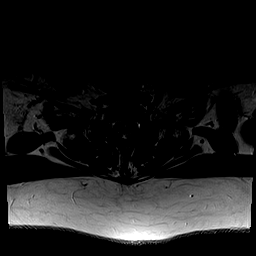
[im 8/28]
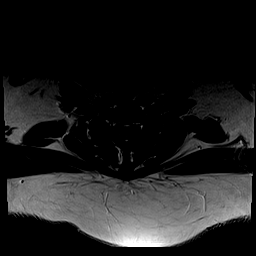
[im 12/28]
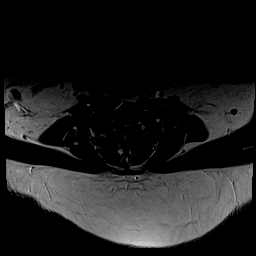
[im 16/28]
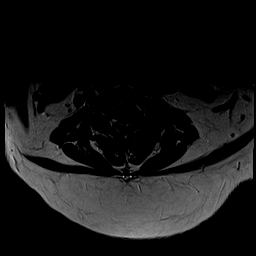
[im 20/28]
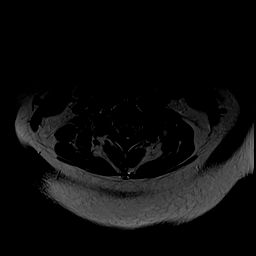
[im 24/28]
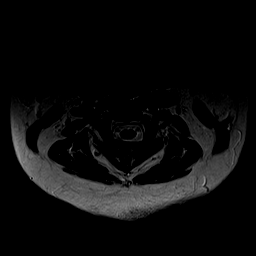
[im 28/28]
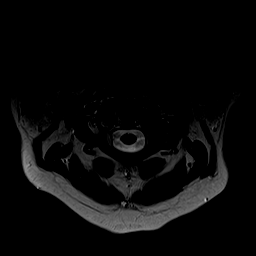

[Series 8: T1 · axial · 3.0mm · 0.35mm/px · z∈[-39,+61]mm · 8 of 28 slices shown (2 of 3)]
[im 1/28]
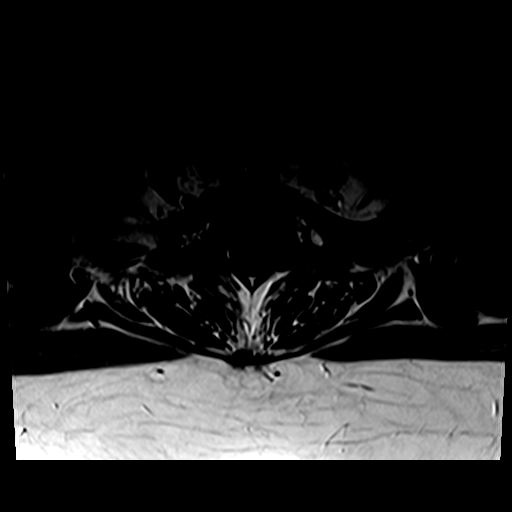
[im 4/28]
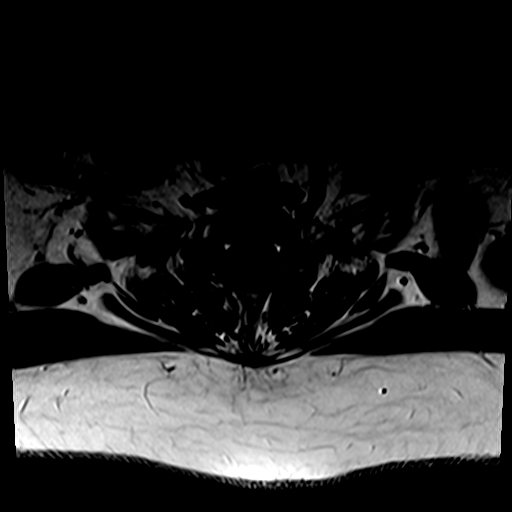
[im 8/28]
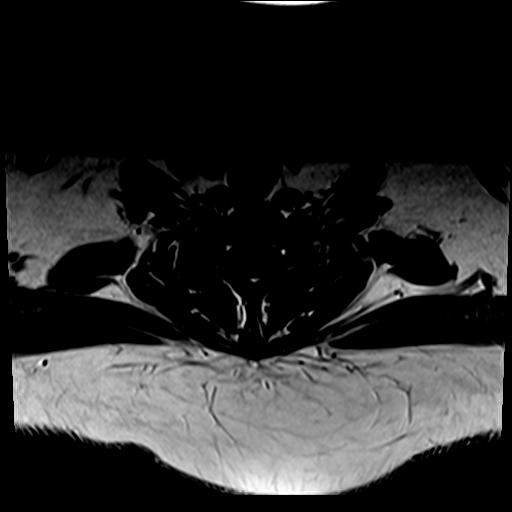
[im 12/28]
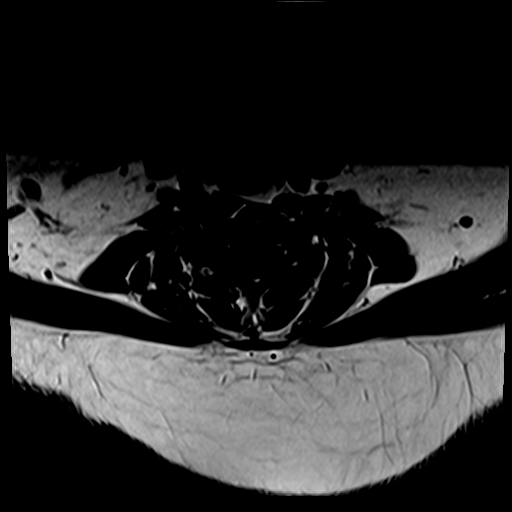
[im 16/28]
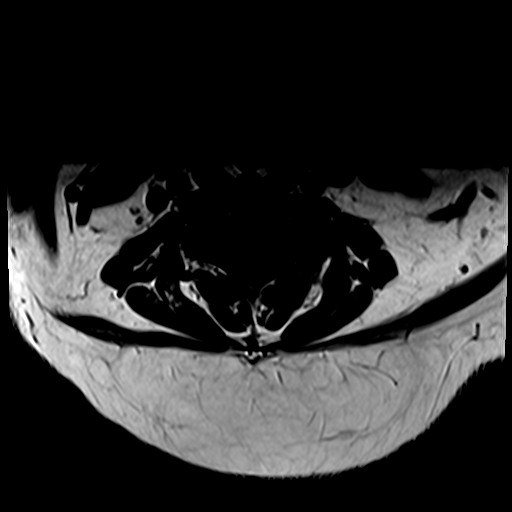
[im 20/28]
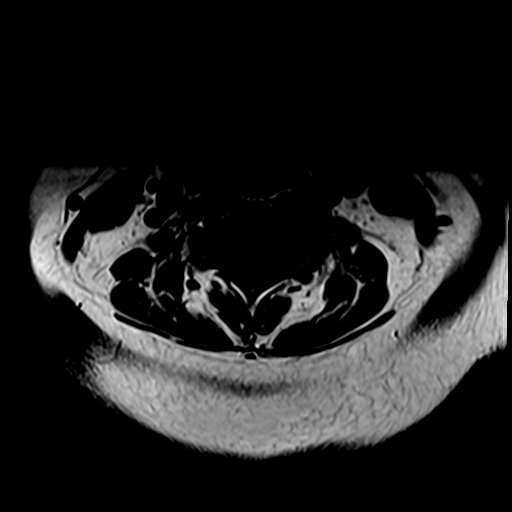
[im 24/28]
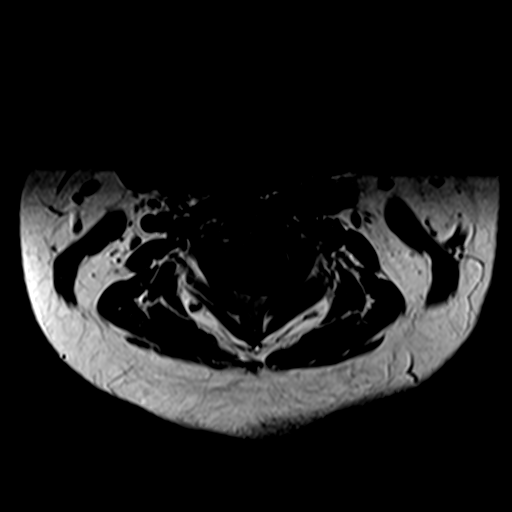
[im 28/28]
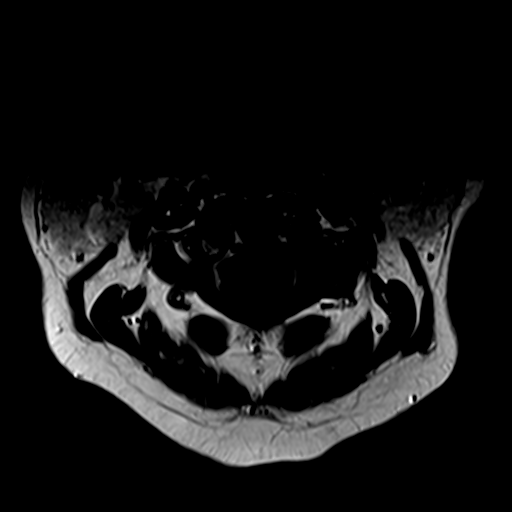

[Series 9: T1 · axial · 3.0mm · 0.35mm/px · z∈[-39,-28]mm · 2 of 28 slices shown (3 of 3)]
[im 1/28]
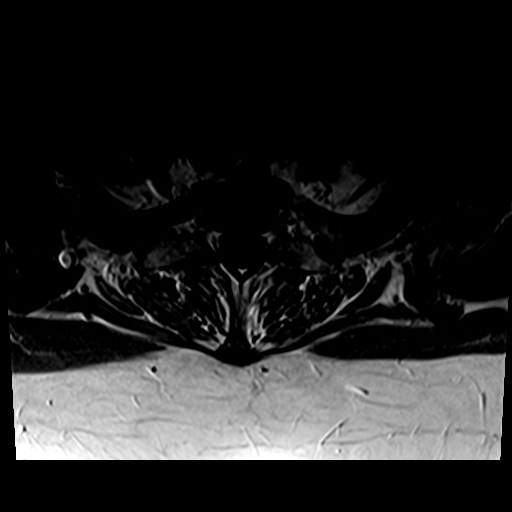
[im 4/28]
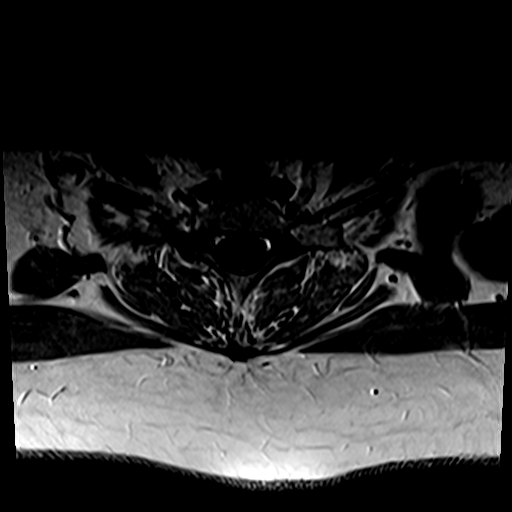

[26 of 48 positions shown; findings below may reference images not displayed]

FINDINGS: Alignment: Straightening of the normal cervical lordosis. No
listhesis.

Vertebrae: Vertebral body height maintained without acute or chronic
fracture. Bone marrow signal intensity within normal limits. No
discrete or worrisome osseous lesions. No abnormal marrow edema or
enhancement.

Cord: Normal signal and morphology.  No abnormal enhancement.

Posterior Fossa, vertebral arteries, paraspinal tissues: Visualized
brain and posterior fossa within normal limits. Craniocervical
junction normal. Paraspinous and prevertebral soft tissues within
normal limits. Normal intravascular flow voids seen within the
vertebral arteries bilaterally.

Disc levels:

C2-C3: Unremarkable.

C3-C4: Mild disc bulge indents the ventral thecal sac. Superimposed
left-sided facet degeneration. No significant spinal stenosis.
Right-sided uncovertebral hypertrophy with resultant mild right C4
foraminal stenosis. Left neural foramen remains patent.

C4-C5: Minimal annular disc bulge. No spinal stenosis. Foramina
remain patent.

C5-C6: Mild annular disc bulge, asymmetric to the left. Flattening
and partial effacement of the left ventral thecal sac with minimal
flattening of the left ventral cord (series 6, image 19). No
significant spinal stenosis. Foramina remain patent.

C6-C7: Broad-based central to right foraminal disc protrusion
(series 6, image 23). Flattening and partial effacement of the
ventral thecal sac with resultant mild spinal stenosis. Minimal cord
flattening without cord signal changes. The ventral right C7 nerve
root could be affected. Foramina otherwise remain patent.

C7-T1:  Unremarkable.

Visualized upper thoracic spine demonstrates no significant finding.
IMPRESSION: 1. Broad-based central to right foraminal disc protrusion at C6-7
with resultant mild spinal stenosis. The ventral right C7 nerve root
could be affected.
2. Mild left eccentric disc bulge at C5-6 with resultant mild
flattening of the left hemi cord, potentially affecting the left C6
nerve root.
3. Mild uncovertebral and at C3-4 with resultant mild right C4
foraminal stenosis.

## 2022-11-22 ENCOUNTER — Other Ambulatory Visit: Payer: Self-pay | Admitting: Internal Medicine

## 2022-11-22 DIAGNOSIS — Z1231 Encounter for screening mammogram for malignant neoplasm of breast: Secondary | ICD-10-CM

## 2022-12-01 ENCOUNTER — Ambulatory Visit
Admission: RE | Admit: 2022-12-01 | Discharge: 2022-12-01 | Disposition: A | Discharge status: Home / Self Care | Payer: BC Managed Care – PPO | Source: Ambulatory Visit | Attending: Internal Medicine | Admitting: Internal Medicine

## 2022-12-01 DIAGNOSIS — Z1231 Encounter for screening mammogram for malignant neoplasm of breast: Secondary | ICD-10-CM | POA: Diagnosis present

## 2023-01-10 ENCOUNTER — Other Ambulatory Visit: Payer: Self-pay | Admitting: Unknown Physician Specialty

## 2023-01-10 DIAGNOSIS — G44059 Short lasting unilateral neuralgiform headache with conjunctival injection and tearing (SUNCT), not intractable: Secondary | ICD-10-CM

## 2023-01-21 ENCOUNTER — Other Ambulatory Visit: Payer: BC Managed Care – PPO

## 2023-02-22 ENCOUNTER — Ambulatory Visit
Admission: RE | Admit: 2023-02-22 | Discharge: 2023-02-22 | Disposition: A | Discharge status: Home / Self Care | Payer: BC Managed Care – PPO | Source: Ambulatory Visit | Attending: Unknown Physician Specialty | Admitting: Unknown Physician Specialty

## 2023-02-22 DIAGNOSIS — G44059 Short lasting unilateral neuralgiform headache with conjunctival injection and tearing (SUNCT), not intractable: Secondary | ICD-10-CM

## 2023-02-22 MED ORDER — GADOPICLENOL 0.5 MMOL/ML IV SOLN
10.0000 mL | Freq: Once | INTRAVENOUS | Status: AC | PRN
  Administered 2023-02-22: 10 mL via INTRAVENOUS

## 2023-09-30 ENCOUNTER — Inpatient Hospital Stay

## 2023-09-30 ENCOUNTER — Other Ambulatory Visit: Payer: Self-pay

## 2023-09-30 ENCOUNTER — Inpatient Hospital Stay
Admission: EM | Admit: 2023-09-30 | Discharge: 2023-10-03 | DRG: 175 | Disposition: A | Discharge status: Home / Self Care | Source: Ambulatory Visit | Attending: Internal Medicine | Admitting: Internal Medicine

## 2023-09-30 ENCOUNTER — Emergency Department

## 2023-09-30 DIAGNOSIS — J111 Influenza due to unidentified influenza virus with other respiratory manifestations: Secondary | ICD-10-CM | POA: Diagnosis present

## 2023-09-30 DIAGNOSIS — Z794 Long term (current) use of insulin: Secondary | ICD-10-CM

## 2023-09-30 DIAGNOSIS — E785 Hyperlipidemia, unspecified: Secondary | ICD-10-CM | POA: Diagnosis present

## 2023-09-30 DIAGNOSIS — Z833 Family history of diabetes mellitus: Secondary | ICD-10-CM | POA: Diagnosis not present

## 2023-09-30 DIAGNOSIS — Z83438 Family history of other disorder of lipoprotein metabolism and other lipidemia: Secondary | ICD-10-CM

## 2023-09-30 DIAGNOSIS — I5A Non-ischemic myocardial injury (non-traumatic): Secondary | ICD-10-CM | POA: Diagnosis present

## 2023-09-30 DIAGNOSIS — Z888 Allergy status to other drugs, medicaments and biological substances status: Secondary | ICD-10-CM | POA: Diagnosis not present

## 2023-09-30 DIAGNOSIS — I82431 Acute embolism and thrombosis of right popliteal vein: Secondary | ICD-10-CM | POA: Diagnosis present

## 2023-09-30 DIAGNOSIS — K760 Fatty (change of) liver, not elsewhere classified: Secondary | ICD-10-CM | POA: Diagnosis present

## 2023-09-30 DIAGNOSIS — Z6841 Body Mass Index (BMI) 40.0 and over, adult: Secondary | ICD-10-CM

## 2023-09-30 DIAGNOSIS — E66813 Obesity, class 3: Secondary | ICD-10-CM | POA: Diagnosis present

## 2023-09-30 DIAGNOSIS — Z881 Allergy status to other antibiotic agents status: Secondary | ICD-10-CM | POA: Diagnosis not present

## 2023-09-30 DIAGNOSIS — I214 Non-ST elevation (NSTEMI) myocardial infarction: Secondary | ICD-10-CM

## 2023-09-30 DIAGNOSIS — I2489 Other forms of acute ischemic heart disease: Secondary | ICD-10-CM | POA: Diagnosis present

## 2023-09-30 DIAGNOSIS — Z8249 Family history of ischemic heart disease and other diseases of the circulatory system: Secondary | ICD-10-CM

## 2023-09-30 DIAGNOSIS — I2609 Other pulmonary embolism with acute cor pulmonale: Principal | ICD-10-CM | POA: Diagnosis present

## 2023-09-30 DIAGNOSIS — Z85820 Personal history of malignant melanoma of skin: Secondary | ICD-10-CM | POA: Diagnosis not present

## 2023-09-30 DIAGNOSIS — E119 Type 2 diabetes mellitus without complications: Secondary | ICD-10-CM | POA: Diagnosis present

## 2023-09-30 DIAGNOSIS — E872 Acidosis, unspecified: Secondary | ICD-10-CM | POA: Diagnosis present

## 2023-09-30 DIAGNOSIS — Z7984 Long term (current) use of oral hypoglycemic drugs: Secondary | ICD-10-CM | POA: Diagnosis not present

## 2023-09-30 DIAGNOSIS — R197 Diarrhea, unspecified: Secondary | ICD-10-CM | POA: Diagnosis not present

## 2023-09-30 DIAGNOSIS — I2699 Other pulmonary embolism without acute cor pulmonale: Secondary | ICD-10-CM | POA: Insufficient documentation

## 2023-09-30 DIAGNOSIS — Z90710 Acquired absence of both cervix and uterus: Secondary | ICD-10-CM

## 2023-09-30 DIAGNOSIS — E876 Hypokalemia: Secondary | ICD-10-CM | POA: Diagnosis not present

## 2023-09-30 DIAGNOSIS — Z79899 Other long term (current) drug therapy: Secondary | ICD-10-CM

## 2023-09-30 DIAGNOSIS — F411 Generalized anxiety disorder: Secondary | ICD-10-CM | POA: Diagnosis present

## 2023-09-30 LAB — CBC WITH DIFFERENTIAL/PLATELET
Abs Immature Granulocytes: 0.01 10*3/uL (ref 0.00–0.07)
Basophils Absolute: 0 10*3/uL (ref 0.0–0.1)
Basophils Relative: 0 %
Eosinophils Absolute: 0 10*3/uL (ref 0.0–0.5)
Eosinophils Relative: 0 %
HCT: 45.1 % (ref 36.0–46.0)
Hemoglobin: 14.5 g/dL (ref 12.0–15.0)
Immature Granulocytes: 0 %
Lymphocytes Relative: 31 %
Lymphs Abs: 1.8 10*3/uL (ref 0.7–4.0)
MCH: 26.3 pg (ref 26.0–34.0)
MCHC: 32.2 g/dL (ref 30.0–36.0)
MCV: 81.9 fL (ref 80.0–100.0)
Monocytes Absolute: 0.2 10*3/uL (ref 0.1–1.0)
Monocytes Relative: 4 %
Neutro Abs: 3.8 10*3/uL (ref 1.7–7.7)
Neutrophils Relative %: 65 %
Platelets: 213 10*3/uL (ref 150–400)
RBC: 5.51 MIL/uL — ABNORMAL HIGH (ref 3.87–5.11)
RDW: 13.5 % (ref 11.5–15.5)
WBC: 5.8 10*3/uL (ref 4.0–10.5)
nRBC: 0 % (ref 0.0–0.2)

## 2023-09-30 LAB — COMPREHENSIVE METABOLIC PANEL WITH GFR
ALT: 44 U/L (ref 0–44)
AST: 32 U/L (ref 15–41)
Albumin: 3.6 g/dL (ref 3.5–5.0)
Alkaline Phosphatase: 84 U/L (ref 38–126)
Anion gap: 12 (ref 5–15)
BUN: 13 mg/dL (ref 6–20)
CO2: 21 mmol/L — ABNORMAL LOW (ref 22–32)
Calcium: 8.9 mg/dL (ref 8.9–10.3)
Chloride: 103 mmol/L (ref 98–111)
Creatinine, Ser: 0.53 mg/dL (ref 0.44–1.00)
GFR, Estimated: >60 mL/min (ref >60)
Glucose, Bld: 284 mg/dL — ABNORMAL HIGH (ref 70–99)
Potassium: 4.1 mmol/L (ref 3.5–5.1)
Sodium: 136 mmol/L (ref 135–145)
Total Bilirubin: 0.7 mg/dL (ref 0.0–1.2)
Total Protein: 7.4 g/dL (ref 6.5–8.1)

## 2023-09-30 LAB — HEPARIN LEVEL (UNFRACTIONATED)
Heparin Unfractionated: 0.36 [IU]/mL (ref 0.30–0.70)
Heparin Unfractionated: <0.1 [IU]/mL — ABNORMAL LOW (ref 0.30–0.70)

## 2023-09-30 LAB — GLUCOSE, CAPILLARY: Glucose-Capillary: 134 mg/dL — ABNORMAL HIGH (ref 70–99)

## 2023-09-30 LAB — PROTIME-INR
INR: 1.1 (ref 0.8–1.2)
Prothrombin Time: 14.5 s (ref 11.4–15.2)

## 2023-09-30 LAB — MRSA NEXT GEN BY PCR, NASAL: MRSA by PCR Next Gen: NOT DETECTED

## 2023-09-30 LAB — TROPONIN I (HIGH SENSITIVITY)
Troponin I (High Sensitivity): 108 ng/L (ref <18)
Troponin I (High Sensitivity): 146 ng/L (ref <18)
Troponin I (High Sensitivity): 106 ng/L (ref <18)

## 2023-09-30 LAB — APTT: aPTT: 61 s — ABNORMAL HIGH (ref 24–36)

## 2023-09-30 LAB — BRAIN NATRIURETIC PEPTIDE: B Natriuretic Peptide: 151.1 pg/mL — ABNORMAL HIGH (ref 0.0–100.0)

## 2023-09-30 LAB — LACTIC ACID, PLASMA
Lactic Acid, Venous: 2.6 mmol/L (ref 0.5–1.9)
Lactic Acid, Venous: 2.4 mmol/L (ref 0.5–1.9)

## 2023-09-30 LAB — CBG MONITORING, ED: Glucose-Capillary: 173 mg/dL — ABNORMAL HIGH (ref 70–99)

## 2023-09-30 MED ORDER — ONDANSETRON HCL 4 MG PO TABS: 4.0000 mg | ORAL_TABLET | Freq: Four times a day (QID) | ORAL | Status: DC | PRN

## 2023-09-30 MED ORDER — IOHEXOL 350 MG/ML SOLN
75.0000 mL | Freq: Once | INTRAVENOUS | Status: AC | PRN
  Administered 2023-09-30: 75 mL via INTRAVENOUS

## 2023-09-30 MED ORDER — LORAZEPAM 2 MG/ML IJ SOLN: 0.5000 mg | Freq: Four times a day (QID) | INTRAMUSCULAR | Status: DC | PRN

## 2023-09-30 MED ORDER — ACETAMINOPHEN 650 MG RE SUPP: 650.0000 mg | Freq: Four times a day (QID) | RECTAL | Status: DC | PRN

## 2023-09-30 MED ORDER — ASPIRIN 81 MG PO CHEW
324.0000 mg | CHEWABLE_TABLET | Freq: Once | ORAL | Status: AC
  Administered 2023-09-30: 324 mg via ORAL
  Filled 2023-09-30: qty 4

## 2023-09-30 MED ORDER — ONDANSETRON HCL 4 MG/2ML IJ SOLN: 4.0000 mg | Freq: Four times a day (QID) | INTRAMUSCULAR | Status: DC | PRN

## 2023-09-30 MED ORDER — ORAL CARE MOUTH RINSE: 15.0000 mL | OROMUCOSAL | Status: DC | PRN

## 2023-09-30 MED ORDER — LORAZEPAM 0.5 MG PO TABS
0.5000 mg | ORAL_TABLET | Freq: Four times a day (QID) | ORAL | Status: DC | PRN
  Administered 2023-10-02: 0.5 mg via ORAL
  Filled 2023-09-30: qty 1

## 2023-09-30 MED ORDER — SODIUM CHLORIDE 0.9 % IV BOLUS
1000.0000 mL | Freq: Once | INTRAVENOUS | Status: AC
  Administered 2023-09-30: 1000 mL via INTRAVENOUS

## 2023-09-30 MED ORDER — NITROGLYCERIN 0.4 MG SL SUBL: 0.4000 mg | SUBLINGUAL_TABLET | SUBLINGUAL | Status: AC | PRN

## 2023-09-30 MED ORDER — HEPARIN BOLUS VIA INFUSION
2700.0000 [IU] | Freq: Once | INTRAVENOUS | Status: AC
  Administered 2023-09-30: 2700 [IU] via INTRAVENOUS
  Filled 2023-09-30: qty 2700

## 2023-09-30 MED ORDER — HEPARIN BOLUS VIA INFUSION
4000.0000 [IU] | Freq: Once | INTRAVENOUS | Status: AC
  Administered 2023-09-30: 4000 [IU] via INTRAVENOUS
  Filled 2023-09-30: qty 4000

## 2023-09-30 MED ORDER — INSULIN ASPART 100 UNIT/ML IJ SOLN
0.0000 [IU] | Freq: Every day | INTRAMUSCULAR | Status: DC
  Administered 2023-10-02: 2 [IU] via SUBCUTANEOUS
  Filled 2023-09-30: qty 1

## 2023-09-30 MED ORDER — HEPARIN (PORCINE) 25000 UT/250ML-% IV SOLN
1500.0000 [IU]/h | INTRAVENOUS | Status: DC
  Administered 2023-09-30: 1050 [IU]/h via INTRAVENOUS
  Administered 2023-10-01 – 2023-10-02 (×2): 1500 [IU]/h via INTRAVENOUS
  Filled 2023-09-30 (×3): qty 250

## 2023-09-30 MED ORDER — INSULIN ASPART 100 UNIT/ML IJ SOLN
0.0000 [IU] | Freq: Three times a day (TID) | INTRAMUSCULAR | Status: DC
  Administered 2023-09-30: 4 [IU] via SUBCUTANEOUS
  Administered 2023-10-01: 3 [IU] via SUBCUTANEOUS
  Administered 2023-10-01 (×2): 4 [IU] via SUBCUTANEOUS
  Administered 2023-10-02: 3 [IU] via SUBCUTANEOUS
  Administered 2023-10-02 (×2): 4 [IU] via SUBCUTANEOUS
  Administered 2023-10-03: 7 [IU] via SUBCUTANEOUS
  Administered 2023-10-03: 4 [IU] via SUBCUTANEOUS
  Filled 2023-09-30 (×9): qty 1

## 2023-09-30 MED ORDER — ACETAMINOPHEN 325 MG PO TABS
650.0000 mg | ORAL_TABLET | Freq: Four times a day (QID) | ORAL | Status: DC | PRN
  Administered 2023-10-01 – 2023-10-02 (×3): 650 mg via ORAL
  Filled 2023-09-30 (×3): qty 2

## 2023-09-30 NOTE — Progress Notes (Addendum)
 PHARMACY - ANTICOAGULATION CONSULT NOTE  Pharmacy Consult for Heparin Infusion Indication: chest pain/ACS--> PE  Allergies  Allergen Reactions   Clindamycin/Lincomycin Swelling    Throat swelling   Amoxicillin-Pot Clavulanate Diarrhea   Cefuroxime Axetil Diarrhea   Pravastatin Diarrhea    Patient Measurements: Height: 5' 7.01" (170.2 cm) Weight: 119.7 kg (263 lb 14.3 oz) IBW/kg (Calculated) : 61.62 HEPARIN DW (KG): 89.8  Vital Signs: Temp: 97.9 F (36.6 C) (04/05 1124) Temp Source: Oral (04/05 1124) BP: 154/80 (04/05 1307) Pulse Rate: 94 (04/05 1307)  Labs: Recent Labs    09/30/23 1121 09/30/23 1306  HGB 14.5  --   HCT 45.1  --   PLT 213  --   CREATININE 0.53  --   TROPONINIHS 108* 146*    CrCl cannot be calculated (Unknown ideal weight.).   Medical History: Past Medical History:  Diagnosis Date   Anxiety    Arthritis    Bilateral lower extremity edema    Cancer (HCC) 2019   melanoma   Cardiac murmur    Diabetes mellitus without complication (HCC)    Pt takes Metformin and glipizide   Diverticulitis of colon    GERD (gastroesophageal reflux disease)    Hepatic steatosis 12/22/2015   Morbid obesity (HCC) 03/17/2015   Obesity, Class II, BMI 35.0-39.9, with comorbidity (see actual BMI)     Medications:  Not on anticoagulation per chart review  Assessment: Patient is a 58 year old female that presented to ED with chief complaint of chest tightness, dry cough, shortness of breath that is worse with exertion, dizziness with exertion, and high blood sugar. EKG showed sinus tachycardia with a rate of 114 without any ST elevation of T wave inversions except for V3 per ED provider. Troponin elevated at 108>146. Pharmacy has been consulted to initiate patient on a heparin infusion for ACS/STEMI--> changed to PE based on CT imaging: Positive for acute PE with CT evidence of right heart strain (RV/LV Ratio = 1.4) consistent with at least submassive (intermediate  risk) PE.  Baseline INR and aPTT ordered.   No signs/symptoms of bleeding in chart. Hgb 14.5. PLT 213.  Goal of Therapy:  Heparin level 0.3-0.7 units/ml Monitor platelets by anticoagulation protocol: Yes   Plan:  Give 4000 bolus x 1 Start heparin infusion at a rate of 1050 units/hr Check heparin level in 6 hours Monitor CBC daily while on heparin  Merryl Hacker, PharmD Clinical Pharmacist  09/30/2023,2:19 PM

## 2023-09-30 NOTE — Assessment & Plan Note (Signed)
 With positive delta, initial value was 108 and on repeat was 146 and patient endorsing chest tightness and shortness of breath Complete echo ordered on admission Continue with heparin gtt.

## 2023-09-30 NOTE — Hospital Course (Addendum)
 Ms. Niya Behler is a 58 year old female with history of truncal obesity, hyperlipidemia, insulin-dependent diabetes mellitus, anxiety, who presents emergency department for chief concerns of chest tightness, shortness of breath, dry cough for the past week.  Vitals in the ED showed T of 97.9, RR of 22, heart rate 115, blood pressure 150/86, SpO2 94% on room air.  Serum sodium is 136, potassium 4.1, chloride 103, bicarb 21, BUN of 13, serum creatinine 0.53, eGFR > 60, nonfasting blood glucose 284, WBC 5.8, hemoglobin 14.5, platelets of 213.  HS troponin was 108, on repeat was 146, lactic acid 2.6, on repeat is 2.4.  ED treatment: Sodium chloride 1 L bolus

## 2023-09-30 NOTE — ED Notes (Signed)
 Heparin level due at 2100. RN clicked off task, error in chart.

## 2023-09-30 NOTE — Assessment & Plan Note (Signed)
 -  This complicates overall care and prognosis.

## 2023-09-30 NOTE — Assessment & Plan Note (Addendum)
 With CTA read of right heart strain, RV to LV ratio is 1.4 Continue heparin per pharmacy Complete echo ordered on admission Ultrasound of the bilateral lower extremity have been ordered to assess for DVT Vascular surgery has been consulted Admit to telemetry cardiac, inpatient

## 2023-09-30 NOTE — H&P (Addendum)
 History and Physical   Jessica Tran:[id number] DOB: 03/21/1966 DOA: 09/30/2023  PCP: Lynnea Ferrier, MD  Outpatient Specialists: Dr. Gershon Crane, endocrinologist Patient coming from: [facility]  I have personally briefly reviewed patient's old medical records in [facility] EMR.  Chief Concern: Chest pain, shortness of breath, dry cough  HPI: Ms. Jessica Tran is a 58 year old female with history of truncal obesity, hyperlipidemia, insulin-dependent diabetes mellitus, anxiety, who presents emergency department for chief concerns of chest tightness, shortness of breath, dry cough for the past week.  Vitals in the ED showed T of 97.9, RR of 22, heart rate 115, blood pressure 150/86, SpO2 94% on room air.  Serum sodium is 136, potassium 4.1, chloride 103, bicarb 21, BUN of 13, serum creatinine 0.53, eGFR > 60, nonfasting blood glucose 284, WBC 5.8, hemoglobin 14.5, platelets of 213.  HS troponin was 108, on repeat was 146, lactic acid 2.6, on repeat is 2.4.  ED treatment: Sodium chloride 1 L bolus ------------------------------------ At bedside, patient was able to tell me her first and last name, age, location, current calendar year.  She reports that 3 to 4 days before/06/2023, she started feeling short of breath with the chest tightness.  She endorses a dry nonproductive cough.  She reports that on Tuesday, 4/1 she checked her temperature and it was 99.3 and this is high for her.  She got tested and she was positive for influenza.  She did not take Tamiflu that was prescribed because of insurance issues.  She reports that the symptoms continued prompting her to present to [facility] clinic for further evaluation.  At [facility] clinic, her oxygen was noted to be low and with SpO2 of 92% and a heart rate really high so she was sent to the emergency department for further evaluation.  She denies trauma to her person, abdominal pain, dysuria, hematuria, diarrhea, blood in her stool.   She reports she is never felt this way before.  She denies history of cardiac disease and states she has never had a stent or heart attack before that she knows of.  Social history: She lives at home with her husband.  She denies tobacco, EtOH, recreational drug use.  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  ROS: Constitutional: no weight change, no fever ENT/Mouth: no sore throat, no rhinorrhea Eyes: no eye pain, no vision changes Cardiovascular: + chest tightness, + dyspnea,  no edema, no palpitations Respiratory: + cough, no sputum, no wheezing Gastrointestinal: no nausea, no vomiting, no diarrhea, no constipation Genitourinary: no urinary incontinence, no dysuria, no hematuria Musculoskeletal: no arthralgias, no myalgias Skin: no skin lesions, no pruritus, Neuro: + weakness, no loss of consciousness, no syncope Psych: no anxiety, no depression, no decrease appetite Heme/Lymph: no bruising, no bleeding  ED Course: Discussed with EDP, patient requiring hospitalization for chief concerns of NSTEMI.  Assessment/Plan  Principal Problem:   NSTEMI (non-ST elevated myocardial infarction) (HCC) Active Problems:   Generalized anxiety disorder   Morbid obesity (HCC)   Hepatic steatosis   BMI 40.0-44.9, adult (HCC)   Anxiety state   Hyperlipidemia   Bilateral pulmonary embolism (HCC)   Assessment and Plan:  * NSTEMI (non-ST elevated myocardial infarction) (HCC) With positive delta, initial value was 108 and on repeat was 146 and patient endorsing chest tightness and shortness of breath Complete echo ordered on admission Continue with heparin gtt.  Bilateral pulmonary embolism (HCC) With CTA read of right heart strain, RV  to LV ratio is 1.4 Continue heparin per pharmacy Complete echo ordered on admission Ultrasound of the bilateral lower extremity have been ordered to assess for DVT Vascular surgery has been consulted Admit to telemetry  cardiac, inpatient  Anxiety state Ativan tablet 0.5 mg/Ativan IM 0.5 mg every 6 hours as needed for anxiety, 3 doses ordered on admission  BMI 40.0-44.9, adult (HCC) This complicates overall care and prognosis.   Hepatic steatosis Extensive discussion with patient at bedside for working with her health insurance to ensure that she gets the appropriate diabetes mellitus medication and follow-up with primary care provider for guidance regarding healthy weight loss Diet counseling: Encourage patient to eat lean protein with steamed vegetables and avoid high carb food items especially highly processed foods.  Patient endorses understanding and states she will try her best.  Morbid obesity (HCC) This complicates overall care and prognosis.   Chart reviewed.   DVT prophylaxis: Heparin GTT Code Status: Full code Diet: N.p.o. pending vascular evaluation Family Communication: A phone call was offered, patient declined stating that she really let her husband know she is being admitted to the hospital Disposition Plan: Pending clinical course; pending vascular evaluation, complete echo Consults called: Vascular Admission status: Telemetry cardiac, inpatient  Past Medical History:  Diagnosis Date   Anxiety    Arthritis    Bilateral lower extremity edema    Cancer (HCC) 2019   melanoma   Cardiac murmur    Diabetes mellitus without complication (HCC)    Pt takes Metformin and glipizide   Diverticulitis of colon    GERD (gastroesophageal reflux disease)    Hepatic steatosis 12/22/2015   Morbid obesity (HCC) 03/17/2015   Obesity, Class II, BMI 35.0-39.9, with comorbidity (see actual BMI)    Past Surgical History:  Procedure Laterality Date   ABDOMINAL HYSTERECTOMY  12/18/2001   LAPROSCOPIC SUPRACERVICAL WITH LYSES OF ADHESIONS   ARTHROSCOPIC REPAIR ACL Right 07/10/2012   MCL TEAR 1995   BARTHOLIN GLAND CYST EXCISION  1995   CHOLECYSTECTOMY  01/01/2004   LAPROSCOPIC   COLONOSCOPY WITH  PROPOFOL N/A 06/24/2016   Procedure: COLONOSCOPY WITH PROPOFOL;  Surgeon: Scot Jun, MD;  Location: [facility] ENDOSCOPY;  Service: Endoscopy;  Laterality: N/A;   ESOPHAGOGASTRODUODENOSCOPY (EGD) WITH PROPOFOL N/A 10/21/2020   Procedure: ESOPHAGOGASTRODUODENOSCOPY (EGD) WITH PROPOFOL;  Surgeon: Toney Reil, MD;  Location: [facility] ENDOSCOPY;  Service: Gastroenterology;  Laterality: N/A;   LAPAROSCOPIC OOPHERECTOMY Left 05/15/2001   LYSIS OF ADHESION  05/15/2001   LAPARSCOPIC   SALPINGECTOMY Left 05/15/2001   LAPROSCOPIC   SEPTOSTOMY  08/17/2006   SHOULDER ARTHROSCOPY WITH OPEN ROTATOR CUFF REPAIR Right 03/18/2022   Procedure: Right shoulder arthroscopic rotator cuff repair, subacromial decompression, distal clavicle excision, and biceps tenodesis;  Surgeon: Signa Kell, MD;  Location: [facility];  Service: Orthopedics;  Laterality: Right;   TONSILLECTOMY  09/27/1991   Social History:  reports that she has never smoked. She has never used smokeless tobacco. She reports current alcohol use. She reports that she does not use drugs.  Allergies  Allergen Reactions   Clindamycin/Lincomycin Swelling    Throat swelling   Amoxicillin-Pot Clavulanate Diarrhea   Cefuroxime Axetil Diarrhea   Pravastatin Diarrhea   Family History  Problem Relation Age of Onset   Hyperlipidemia Mother    Hypertension Mother    Diabetes Mother    Hyperlipidemia Father    Hypertension Father    Breast cancer Neg Hx    Family history: Family history reviewed and not pertinent.  Prior to Admission medications   Medication Sig Start Date End Date Taking? Authorizing Provider  albuterol (VENTOLIN HFA) 108 (90 Base) MCG/ACT inhaler Inhale into the lungs. Patient not taking: Reported on 03/09/2022 11/02/17   [provider]  ALPRAZolam Prudy Feeler) 0.25 MG tablet alprazolam 0.25 mg tablet    [provider]  atorvastatin (LIPITOR) 10 MG tablet Take 1 tablet by mouth daily. 07/14/20   [provider]  Barberry-Oreg Grape-Goldenseal (BERBERINE COMPLEX PO) Take 1,200 mg by mouth daily.    [provider]  GE100 BLOOD GLUCOSE TEST test strip USE TO TEST THREE TIMES DAILY 09/21/20   [provider]  glipiZIDE (GLIPIZIDE XL) 10 MG 24 hr tablet Take 2 tablets (20 mg total) by mouth daily with breakfast. 01/07/16   Lada, Janit Bern, MD  insulin NPH-regular Human (70-30) 100 UNIT/ML injection Inject into the skin. Patient not taking: Reported on 03/09/2022 03/30/20 03/30/21  [provider]  meclizine (ANTIVERT) 25 MG tablet Take 1 tablet (25 mg total) by mouth 3 (three) times daily as needed for dizziness. Patient not taking: Reported on 03/09/2022 06/29/20   Delton Prairie, MD  metFORMIN (GLUCOPHAGE-XR) 500 MG 24 hr tablet Take 1 tablet (500 mg total) by mouth every evening. appt and labs needed for further refills please Patient not taking: Reported on 03/09/2022 04/28/16   Kerman Passey, MD  omeprazole (PRILOSEC) 40 MG capsule TAKE 1 CAPSULE BY MOUTH 2 TIMES DAILY BEFORE A MEAL 06/16/21   Vanga, Loel Dubonnet, MD  ondansetron (ZOFRAN) 4 MG tablet Take 1 tablet (4 mg total) by mouth every 8 (eight) hours as needed. 10/03/21   Phineas Semen, MD  ondansetron (ZOFRAN-ODT) 4 MG disintegrating tablet Take 1 tablet (4 mg total) by mouth every 8 (eight) hours as needed for nausea or vomiting. 03/18/22   Signa Kell, MD  pioglitazone (ACTOS) 15 MG tablet Take 15 mg by mouth daily.    [provider]  tamsulosin (FLOMAX) 0.4 MG CAPS capsule Take 1 capsule (0.4 mg total) by mouth daily. Patient not taking: Reported on 03/09/2022 10/03/21   Phineas Semen, MD  valACYclovir (VALTREX) 500 MG tablet valacyclovir 500 mg tablet    [provider]   Physical Exam: Vitals:   09/30/23 1124 09/30/23 1307 09/30/23 1416 09/30/23 1534  BP: (!) 150/86 (!) 154/80    Pulse: (!) 115 94    Resp: (!) 22 16    Temp: 97.9 F (36.6 C)   97.8 F (36.6 C)  TempSrc: Oral   Oral   SpO2: 94% 96%    Weight:   119.7 kg   Height:   5' 7.01" (1.702 m)    Constitutional: appears age-appropriate, mildly anxious Eyes: PERRL, lids and conjunctivae normal ENMT: Mucous membranes are moist. Posterior pharynx clear of any exudate or lesions. Age-appropriate dentition. Hearing appropriate Neck: normal, supple, no masses, no thyromegaly Respiratory: clear to auscultation bilaterally, no wheezing, no crackles. Normal respiratory effort. No accessory muscle use.  Cardiovascular: Sinus tachycardia with regular rhythm, no murmurs / rubs / gallops. No extremity edema. 2+ pedal pulses. No carotid bruits.  Abdomen: Morbidly obese abdomen, no tenderness, no masses palpated, no hepatosplenomegaly. Bowel sounds positive.  Musculoskeletal: no clubbing / cyanosis. No joint deformity upper and lower extremities. Good ROM, no contractures, no atrophy. Normal muscle tone.  Skin: no rashes, lesions, ulcers. No induration Neurologic: Sensation intact. Strength 5/5 in all 4.  Psychiatric: Normal judgment and insight. Alert and oriented x 3. Normal mood.  EKG: independently reviewed, showing sinus tachycardia with rate of 114, Qtc 438  Chest x-ray on Admission: I personally reviewed and I agree with radiologist reading as below.  CT Angio Chest PE W and/or Wo Contrast Result Date: 09/30/2023 CLINICAL DATA:  Shortness of breath. Concern for pulmonary embolism. EXAM: CT ANGIOGRAPHY CHEST WITH CONTRAST TECHNIQUE: Multidetector CT imaging of the chest was performed using the standard protocol during bolus administration of intravenous contrast. Multiplanar CT image reconstructions and MIPs were obtained to evaluate the vascular anatomy. RADIATION DOSE REDUCTION: This exam was performed according to the departmental dose-optimization program which includes automated exposure control, adjustment of the mA and/or kV according to patient size and/or use of iterative reconstruction technique. CONTRAST:  75mL  OMNIPAQUE IOHEXOL 350 MG/ML SOLN COMPARISON:  Chest radiograph dated 09/30/2023 and CT dated 08/12/2021. FINDINGS: Cardiovascular: There is no cardiomegaly or pericardial effusion. There is dilatation of the right heart chambers with RV/LV ratio of approximately 1.4 indicative of right heart straining. The thoracic aorta is unremarkable. Bilateral pulmonary artery emboli involving the lobar and segmental branches of the lower lobes and upper lobes. Mediastinum/Nodes: No hilar or mediastinal adenopathy. The esophagus is grossly unremarkable. No mediastinal fluid collection. Lungs/Pleura: No focal consolidation, pleural effusion, pneumothorax. The central airways are patent. Upper Abdomen: Fatty liver.  Cholecystectomy. Musculoskeletal: No acute osseous pathology. Review of the MIP images confirms the above findings. IMPRESSION: Positive for acute PE with CT evidence of right heart strain (RV/LV Ratio = 1.4) consistent with at least submassive (intermediate risk) PE. The presence of right heart strain has been associated with an increased risk of morbidity and mortality. Please refer to the "Code PE Focused" order set in EPIC. These results were called by telephone at the time of interpretation on 09/30/2023 at 3:12 pm to Dr. Sedalia Muta, who verbally acknowledged these results. Electronically Signed   By: Elgie Collard M.D.   On: 09/30/2023 15:34   DG Chest 2 View Result Date: 09/30/2023 CLINICAL DATA:  Shortness of breath EXAM: CHEST - 2 VIEW COMPARISON:  06/26/2021 x-ray.  CT 08/12/2021 FINDINGS: The heart size and mediastinal contours are within normal limits. No consolidation, pneumothorax or effusion. No edema. The visualized skeletal structures are unremarkable. IMPRESSION: No acute cardiopulmonary disease. Electronically Signed   By: Karen Kays M.D.   On: 09/30/2023 12:02   Labs on Admission: I have personally reviewed following labs  CBC: Recent Labs  Lab 09/30/23 1121  WBC 5.8  NEUTROABS 3.8  HGB 14.5   HCT 45.1  MCV 81.9  PLT 213   Basic Metabolic Panel: Recent Labs  Lab 09/30/23 1121  NA 136  K 4.1  CL 103  CO2 21*  GLUCOSE 284*  BUN 13  CREATININE 0.53  CALCIUM 8.9   GFR: Estimated Creatinine Clearance: 101.4 mL/min (by C-G formula based on SCr of 0.53 mg/dL).  Liver Function Tests: Recent Labs  Lab 09/30/23 1121  AST 32  ALT 44  ALKPHOS 84  BILITOT 0.7  PROT 7.4  ALBUMIN 3.6   Urine analysis:    Component Value Date/Time   COLORURINE YELLOW (A) 10/03/2021 2157   APPEARANCEUR CLOUDY (A) 10/03/2021 2157   APPEARANCEUR Clear 07/28/2014 1332   LABSPEC 1.026 10/03/2021 2157   LABSPEC 1.020 07/28/2014 1332   PHURINE 5.0 10/03/2021 2157   GLUCOSEU NEGATIVE 10/03/2021 2157   GLUCOSEU Negative 07/28/2014 1332   HGBUR NEGATIVE 10/03/2021 2157   BILIRUBINUR NEGATIVE 10/03/2021 2157   BILIRUBINUR negative 03/17/2015 0946   BILIRUBINUR Negative 07/28/2014 1332  KETONESUR 20 (A) 10/03/2021 2157   PROTEINUR NEGATIVE 10/03/2021 2157   UROBILINOGEN 0.2 03/17/2015 0946   NITRITE NEGATIVE 10/03/2021 2157   LEUKOCYTESUR NEGATIVE 10/03/2021 2157   LEUKOCYTESUR 1+ 07/28/2014 1332   CRITICAL CARE Performed by: Dr. Sedalia Muta  Total critical care time: 32 minutes  Critical care time was exclusive of separately billable procedures and treating other patients.  Critical care was necessary to treat or prevent imminent or life-threatening deterioration.  Critical care was time spent personally by me on the following activities: development of treatment plan with patient as well as nursing, discussions with consultants, evaluation of patient's response to treatment, examination of patient, obtaining history from patient or surrogate, ordering and performing treatments and interventions, ordering and review of laboratory studies, ordering and review of radiographic studies, pulse oximetry and re-evaluation of patient's condition.  This document was prepared using Dragon Voice  Recognition software and may include unintentional dictation errors.  Dr. Sedalia Muta [facility]  If 7PM-7AM, please contact overnight-coverage provider If 7AM-7PM, please contact day attending provider [email or web address]  09/30/2023, 6:22 PM

## 2023-09-30 NOTE — Assessment & Plan Note (Signed)
 Ativan tablet 0.5 mg/Ativan IM 0.5 mg every 6 hours as needed for anxiety, 3 doses ordered on admission

## 2023-09-30 NOTE — Assessment & Plan Note (Signed)
 Extensive discussion with patient at bedside for working with her health insurance to ensure that she gets the appropriate diabetes mellitus medication and follow-up with primary care provider for guidance regarding healthy weight loss Diet counseling: Encourage patient to eat lean protein with steamed vegetables and avoid high carb food items especially highly processed foods.  Patient endorses understanding and states she will try her best.

## 2023-09-30 NOTE — ED Provider Notes (Signed)
 [facility] Provider Note    Event Date/Time   First MD Initiated Contact with Patient 09/30/23 1250     (approximate)   History   Shortness of Breath   HPI  Jessica Tran is a 58 y.o. female who was diagnosed with the flu on Tuesday who comes in with worsening shortness of breath did take some prednisone at home that was leftover from prior as well as Tamiflu but stopped it secondary to side effects.  No history of asthma, COPD.  Patient's oxygen level was 92% with heart rate of 120.   Patient reports some shortness of breath with exertion as well as chest tightness.  Denies any falls or hitting her head.  Denies any abdominal pain.  Physical Exam   Triage Vital Signs: ED Triage Vitals [09/30/23 1124]  Encounter Vitals Group     BP (!) 150/86     Systolic BP Percentile      Diastolic BP Percentile      Pulse Rate (!) 115     Resp (!) 22     Temp 97.9 F (36.6 C)     Temp Source Oral     SpO2 94 %     Weight      Height      Head Circumference      Peak Flow      Pain Score 0     Pain Loc      Pain Education      Exclude from Growth Chart     Most recent vital signs: Vitals:   09/30/23 1124  BP: (!) 150/86  Pulse: (!) 115  Resp: (!) 22  Temp: 97.9 F (36.6 C)  SpO2: 94%     General: Awake, no distress.  CV:  Good peripheral perfusion.  Tachycardic Resp:  Normal effort.  Abd:  No distention.  Soft and nontender Other:   No swelling in legs no calf tenderness  ED Results / Procedures / Treatments   Labs (all labs ordered are listed, but only abnormal results are displayed) Labs Reviewed  CBC WITH DIFFERENTIAL/PLATELET - Abnormal; Notable for the following components:      Result Value   RBC 5.51 (*)    All other components within normal limits  COMPREHENSIVE METABOLIC PANEL WITH GFR - Abnormal; Notable for the following components:   CO2 21 (*)    Glucose, Bld 284 (*)    All other components within normal limits   LACTIC ACID, PLASMA - Abnormal; Notable for the following components:   Lactic Acid, Venous 2.6 (*)    All other components within normal limits  TROPONIN I (HIGH SENSITIVITY) - Abnormal; Notable for the following components:   Troponin I (High Sensitivity) 108 (*)    All other components within normal limits  LACTIC ACID, PLASMA     EKG  My interpretation of EKG:  Sinus tachycardia rate of 114 without any ST elevation or T wave inversions except for V3, normal intervals  RADIOLOGY I have reviewed the xray personally and interpreted and no evidence of any pneumonia   PROCEDURES:  Critical Care performed: No  .1-3 Lead EKG Interpretation  Performed by: Concha Se, MD Authorized by: Concha Se, MD     Interpretation: normal     ECG rate:  90   ECG rate assessment: normal     Rhythm: sinus rhythm     Ectopy: none     Conduction: normal   .Critical Care  Performed by: Concha Se, MD Authorized by: Concha Se, MD   Critical care provider statement:    Critical care time (minutes):  30   Critical care was necessary to treat or prevent imminent or life-threatening deterioration of the following conditions: NSTEMI.   Critical care was time spent personally by me on the following activities:  Development of treatment plan with patient or surrogate, discussions with consultants, evaluation of patient's response to treatment, examination of patient, ordering and review of laboratory studies, ordering and review of radiographic studies, ordering and performing treatments and interventions, pulse oximetry, re-evaluation of patient's condition and review of old charts    MEDICATIONS ORDERED IN ED: Medications  acetaminophen (TYLENOL) tablet 650 mg (has no administration in time range)    Or  acetaminophen (TYLENOL) suppository 650 mg (has no administration in time range)  ondansetron (ZOFRAN) tablet 4 mg (has no administration in time range)    Or  ondansetron  (ZOFRAN) injection 4 mg (has no administration in time range)  nitroGLYCERIN (NITROSTAT) SL tablet 0.4 mg (has no administration in time range)  LORazepam (ATIVAN) tablet 0.5 mg (has no administration in time range)    Or  LORazepam (ATIVAN) injection 0.5 mg (has no administration in time range)  insulin aspart (novoLOG) injection 0-20 Units (has no administration in time range)  insulin aspart (novoLOG) injection 0-5 Units (has no administration in time range)  heparin bolus via infusion 4,000 Units (has no administration in time range)  heparin ADULT infusion 100 units/mL (25000 units/254mL) (has no administration in time range)  sodium chloride 0.9 % bolus 1,000 mL (1,000 mLs Intravenous New Bag/Given 09/30/23 1405)     IMPRESSION / MDM / ASSESSMENT AND PLAN / ED COURSE  I reviewed the triage vital signs and the nursing notes.   Patient's presentation is most consistent with acute presentation with potential threat to life or bodily function.   Differential includes flu, myocarditis, PE, ACS  Troponin is elevated at 108.  CBC shows normal white count.  CMP shows elevated sugar but anion gap is normal.  Lactate is elevated.  Her chest x-ray is without any evidence of pneumonia.  Troponin is elevated we will get a repeat.  Lactate elevated will give some fluids CBC is normal CMP shows elevated glucose but no evidence of DKA  Given elevated troponin I do feel like patient will need admission probably myocarditis however CT PE is still pending to rule out pulmonary embolism.  Patient was started on heparin while pending CT imaging.  I will discuss with hospital team for admission.  The patient is on the cardiac monitor to evaluate for evidence of arrhythmia and/or significant heart rate changes.      FINAL CLINICAL IMPRESSION(S) / ED DIAGNOSES   Final diagnoses:  Flu  NSTEMI (non-ST elevated myocardial infarction) (HCC)     Rx / DC Orders   ED Discharge Orders     None         Note:  This document was prepared using Dragon voice recognition software and may include unintentional dictation errors.   Concha Se, MD 09/30/23 [phone]

## 2023-09-30 NOTE — Progress Notes (Signed)
 PHARMACY - ANTICOAGULATION CONSULT NOTE  Pharmacy Consult for Heparin Infusion Indication: chest pain/ACS--> PE  Allergies  Allergen Reactions   Clindamycin/Lincomycin Swelling    Throat swelling   Amoxicillin-Pot Clavulanate Diarrhea   Cefuroxime Axetil Diarrhea   Pravastatin Diarrhea    Patient Measurements: Height: 5' 7.01" (170.2 cm) Weight: 119.7 kg (263 lb 14.3 oz) IBW/kg (Calculated) : 61.62 HEPARIN DW (KG): 89.8  Vital Signs: Temp: 97.4 F (36.3 C) (04/05 2334) Temp Source: Oral (04/05 2106) BP: 150/81 (04/05 2334) Pulse Rate: 85 (04/05 2334)  Labs: Recent Labs    09/30/23 1121 09/30/23 1306 09/30/23 1526 09/30/23 1528 09/30/23 2149  HGB 14.5  --   --   --   --   HCT 45.1  --   --   --   --   PLT 213  --   --   --   --   APTT  --   --   --  61*  --   LABPROT  --   --   --  14.5  --   INR  --   --   --  1.1  --   HEPARINUNFRC  --   --  0.36  --  <0.10*  CREATININE 0.53  --   --   --   --   TROPONINIHS 108* 146*  --   --  106*    Estimated Creatinine Clearance: 101.4 mL/min (by C-G formula based on SCr of 0.53 mg/dL).   Medical History: Past Medical History:  Diagnosis Date   Anxiety    Arthritis    Bilateral lower extremity edema    Cancer (HCC) 2019   melanoma   Cardiac murmur    Diabetes mellitus without complication (HCC)    Pt takes Metformin and glipizide   Diverticulitis of colon    GERD (gastroesophageal reflux disease)    Hepatic steatosis 12/22/2015   Morbid obesity (HCC) 03/17/2015   Obesity, Class II, BMI 35.0-39.9, with comorbidity (see actual BMI)     Medications:  Not on anticoagulation per chart review  Assessment: Patient is a 58 year old female that presented to ED with chief complaint of chest tightness, dry cough, shortness of breath that is worse with exertion, dizziness with exertion, and high blood sugar. EKG showed sinus tachycardia with a rate of 114 without any ST elevation of T wave inversions except for V3 per ED  provider. Troponin elevated at 108>146. Pharmacy has been consulted to initiate patient on a heparin infusion for ACS/STEMI--> changed to PE based on CT imaging: Positive for acute PE with CT evidence of right heart strain (RV/LV Ratio = 1.4) consistent with at least submassive (intermediate risk) PE.  Baseline INR and aPTT ordered.   No signs/symptoms of bleeding in chart. Hgb 14.5. PLT 213.  Goal of Therapy:  Heparin level 0.3-0.7 units/ml Monitor platelets by anticoagulation protocol: Yes  04/05 2149 HL <0.1   Plan:  Contacted RN, no known significant line issues or stoppages Suspect heparin drip started prior to baseline labs being drawn Give 2700 bolus x 1 Increase heparin infusion to rate of 1350 units/hr Recheck HL in 6 hrs after rate change Monitor CBC daily while on heparin  Otelia Sergeant, PharmD, [facility] 09/30/2023 11:54 PM

## 2023-09-30 NOTE — Consult Note (Signed)
 [facility] Vascular Consult Note  MRN : [id number]  Jessica Tran is a 58 y.o. ([birth year]) female who presents with chief complaint of  Chief Complaint  Patient presents with   Shortness of Breath  .  History of Present Illness: She had a dry cough and chest pressure for three days last week associated with exertional SOB.  She then developed a runny nose and fever and went to her PCP and tested positive for Flu.  She had chills and was not active and did not drink much and then noted she could not walk the dog due to SOB.  She was seen by her PCP who noted to low oxygen sat and sent her for a CTA-PE which was positive and she is admitted.  Currently the flu symptoms have resolved and she is fine at rest but SOB with activity.  No leg complaints.  Current Facility-Administered Medications  Medication Dose Route Frequency Provider Last Rate Last Admin   acetaminophen (TYLENOL) tablet 650 mg  650 mg Oral Q6H PRN Cox, Amy N, DO       Or   acetaminophen (TYLENOL) suppository 650 mg  650 mg Rectal Q6H PRN Cox, Amy N, DO       heparin ADULT infusion 100 units/mL (25000 units/288mL)  1,050 Units/hr Intravenous Continuous Hunt, Madison H, RPH 10.5 mL/hr at 09/30/23 1521 1,050 Units/hr at 09/30/23 1521   insulin aspart (novoLOG) injection 0-20 Units  0-20 Units Subcutaneous TID WC Cox, Amy N, DO   4 Units at 09/30/23 1721   insulin aspart (novoLOG) injection 0-5 Units  0-5 Units Subcutaneous QHS Cox, Amy N, DO       LORazepam (ATIVAN) tablet 0.5 mg  0.5 mg Oral Q6H PRN Cox, Amy N, DO       Or   LORazepam (ATIVAN) injection 0.5 mg  0.5 mg Intramuscular Q6H PRN Cox, Amy N, DO       nitroGLYCERIN (NITROSTAT) SL tablet 0.4 mg  0.4 mg Sublingual Q5 min PRN Cox, Amy N, DO       ondansetron (ZOFRAN) tablet 4 mg  4 mg Oral Q6H PRN Cox, Amy N, DO       Or   ondansetron (ZOFRAN) injection 4 mg  4 mg Intravenous Q6H PRN Cox, Amy N, DO       Current Outpatient Medications   Medication Sig Dispense Refill   ALPRAZolam (XANAX) 0.25 MG tablet alprazolam 0.25 mg tablet     atorvastatin (LIPITOR) 10 MG tablet Take 1 tablet by mouth daily.     Barberry-Oreg Grape-Goldenseal (BERBERINE COMPLEX PO) Take 1,200 mg by mouth daily.     FARXIGA 10 MG TABS tablet Take 1 tablet by mouth daily.     GE100 BLOOD GLUCOSE TEST test strip USE TO TEST THREE TIMES DAILY     glipiZIDE (GLIPIZIDE XL) 10 MG 24 hr tablet Take 2 tablets (20 mg total) by mouth daily with breakfast. 60 tablet 5   losartan (COZAAR) 25 MG tablet Take 1 tablet by mouth daily.     omeprazole (PRILOSEC) 40 MG capsule TAKE 1 CAPSULE BY MOUTH 2 TIMES DAILY BEFORE A MEAL (Patient taking differently: 20 mg daily.) 60 capsule 5   ondansetron (ZOFRAN) 4 MG tablet Take 1 tablet (4 mg total) by mouth every 8 (eight) hours as needed. 20 tablet 0   albuterol (VENTOLIN HFA) 108 (90 Base) MCG/ACT inhaler Inhale into the lungs. (Patient not taking: Reported on 03/09/2022)  insulin NPH-regular Human (70-30) 100 UNIT/ML injection Inject into the skin. (Patient not taking: Reported on 03/09/2022)     meclizine (ANTIVERT) 25 MG tablet Take 1 tablet (25 mg total) by mouth 3 (three) times daily as needed for dizziness. (Patient not taking: Reported on 03/09/2022) 30 tablet 1   metFORMIN (GLUCOPHAGE-XR) 500 MG 24 hr tablet Take 1 tablet (500 mg total) by mouth every evening. appt and labs needed for further refills please (Patient not taking: Reported on 03/09/2022) 30 tablet 0   ondansetron (ZOFRAN-ODT) 4 MG disintegrating tablet Take 1 tablet (4 mg total) by mouth every 8 (eight) hours as needed for nausea or vomiting. (Patient not taking: Reported on 09/30/2023) 20 tablet 0   pioglitazone (ACTOS) 15 MG tablet Take 15 mg by mouth daily. (Patient not taking: Reported on 09/30/2023)     Semaglutide,0.25 or 0.5MG /DOS, 2 MG/3ML SOPN Inject 0.25 mg into the skin once a week. (Patient not taking: Reported on 09/30/2023)     tamsulosin (FLOMAX) 0.4  MG CAPS capsule Take 1 capsule (0.4 mg total) by mouth daily. (Patient not taking: Reported on 03/09/2022) 14 capsule 0   valACYclovir (VALTREX) 500 MG tablet valacyclovir 500 mg tablet (Patient not taking: Reported on 09/30/2023)      Past Medical History:  Diagnosis Date   Anxiety    Arthritis    Bilateral lower extremity edema    Cancer (HCC) 2019   melanoma   Cardiac murmur    Diabetes mellitus without complication (HCC)    Pt takes Metformin and glipizide   Diverticulitis of colon    GERD (gastroesophageal reflux disease)    Hepatic steatosis 12/22/2015   Morbid obesity (HCC) 03/17/2015   Obesity, Class II, BMI 35.0-39.9, with comorbidity (see actual BMI)     Past Surgical History:  Procedure Laterality Date   ABDOMINAL HYSTERECTOMY  12/18/2001   LAPROSCOPIC SUPRACERVICAL WITH LYSES OF ADHESIONS   ARTHROSCOPIC REPAIR ACL Right 07/10/2012   MCL TEAR 1995   BARTHOLIN GLAND CYST EXCISION  1995   CHOLECYSTECTOMY  01/01/2004   LAPROSCOPIC   COLONOSCOPY WITH PROPOFOL N/A 06/24/2016   Procedure: COLONOSCOPY WITH PROPOFOL;  Surgeon: Scot Jun, MD;  Location: [facility] ENDOSCOPY;  Service: Endoscopy;  Laterality: N/A;   ESOPHAGOGASTRODUODENOSCOPY (EGD) WITH PROPOFOL N/A 10/21/2020   Procedure: ESOPHAGOGASTRODUODENOSCOPY (EGD) WITH PROPOFOL;  Surgeon: Toney Reil, MD;  Location: [facility] ENDOSCOPY;  Service: Gastroenterology;  Laterality: N/A;   LAPAROSCOPIC OOPHERECTOMY Left 05/15/2001   LYSIS OF ADHESION  05/15/2001   LAPARSCOPIC   SALPINGECTOMY Left 05/15/2001   LAPROSCOPIC   SEPTOSTOMY  08/17/2006   SHOULDER ARTHROSCOPY WITH OPEN ROTATOR CUFF REPAIR Right 03/18/2022   Procedure: Right shoulder arthroscopic rotator cuff repair, subacromial decompression, distal clavicle excision, and biceps tenodesis;  Surgeon: Signa Kell, MD;  Location[facility];  Service: Orthopedics;  Laterality: Right;   TONSILLECTOMY  09/27/1991    Social History Social History   Tobacco  Use   Smoking status: Never   Smokeless tobacco: Never  Vaping Use   Vaping status: Never Used  Substance Use Topics   Alcohol use: Yes    Alcohol/week: 0.0 standard drinks of alcohol    Comment: occasionally   Drug use: No    Family History Family History  Problem Relation Age of Onset   Hyperlipidemia Mother    Hypertension Mother    Diabetes Mother    Hyperlipidemia Father    Hypertension Father    Breast cancer Neg Hx  Allergies  Allergen Reactions   Clindamycin/Lincomycin Swelling    Throat swelling   Amoxicillin-Pot Clavulanate Diarrhea   Cefuroxime Axetil Diarrhea   Pravastatin Diarrhea     REVIEW OF SYSTEMS (Negative unless checked)  Constitutional: [] Weight loss  [x] Fever  [] Chills Cardiac: [x] Chest pain   [x] Chest pressure   [x] Palpitations   [] Shortness of breath when laying flat   [] Shortness of breath at rest   [x] Shortness of breath with exertion. Vascular:  [] Pain in legs with walking   [] Pain in legs at rest   [] Pain in legs when laying flat   [] Claudication   [] Pain in feet when walking  [] Pain in feet at rest  [] Pain in feet when laying flat   [] History of DVT   [] Phlebitis   [] Swelling in legs   [] Varicose veins   [] Non-healing ulcers Pulmonary:   [] Uses home oxygen   [] Productive cough   [] Hemoptysis   [] Wheeze  [] COPD   [] Asthma Neurologic:  [] Dizziness  [] Blackouts   [] Seizures   [] History of stroke   [] History of TIA  [] Aphasia   [] Temporary blindness   [] Dysphagia   [] Weakness or numbness in arms   [] Weakness or numbness in legs Musculoskeletal:  [] Arthritis   [] Joint swelling   [] Joint pain   [] Low back pain Hematologic:  [] Easy bruising  [] Easy bleeding   [] Hypercoagulable state   [] Anemic  [] Hepatitis Gastrointestinal:  [] Blood in stool   [] Vomiting blood  [] Gastroesophageal reflux/heartburn   [] Difficulty swallowing. Genitourinary:  [] Chronic kidney disease   [] Difficult urination  [] Frequent urination  [] Burning with urination   [] Blood in  urine Skin:  [] Rashes   [] Ulcers   [] Wounds Psychological:  [x] History of anxiety   []  History of major depression.  Physical Examination  Vitals:   09/30/23 1416 09/30/23 1534 09/30/23 1900 09/30/23 1907  BP:   (!) 175/90   Pulse:   92   Resp:   14   Temp:  97.8 F (36.6 C)  97.7 F (36.5 C)  TempSrc:  Oral  Oral  SpO2:   93%   Weight: 119.7 kg     Height: 5' 7.01" (1.702 m)      Body mass index is 41.32 kg/m. Gen:  WD/WN, NAD Head: [place]/AT, No temporalis wasting. Prominent temp pulse not noted. Ear/Nose/Throat: Hearing grossly intact, nares w/o erythema or drainage, oropharynx w/o Erythema/Exudate Eyes: Sclera non-icteric, conjunctiva clear Neck: Trachea midline.  No JVD.  Pulmonary:  Good air movement, respirations not labored, equal bilaterally.  Cardiac: RRR, normal S1, S2. Vascular:  Vessel Right Left  Radial Palpable Palpable  Ulnar Palpable Palpable  Brachial Palpable Palpable  Carotid Palpable, without bruit Palpable, without bruit  Aorta Not palpable N/A  Femoral Palpable Palpable  Popliteal Palpable Palpable  PT Palpable Palpable  DP Palpable Palpable   Gastrointestinal: obese, soft, non-tender/non-distended. No guarding/reflex.  Musculoskeletal: M/S 5/5 throughout.  Extremities without ischemic changes.  No deformity or atrophy. No edema. Neurologic: Sensation grossly intact in extremities.  Symmetrical.  Speech is fluent. Motor exam as listed above. Psychiatric: Judgment intact, Mood & affect appropriate for pt's clinical situation. Dermatologic: No rashes or ulcers noted.  No cellulitis or open wounds. Lymph : No Cervical, Axillary, or Inguinal lymphadenopathy.     CBC Lab Results  Component Value Date   WBC 5.8 09/30/2023   HGB 14.5 09/30/2023   HCT 45.1 09/30/2023   MCV 81.9 09/30/2023   PLT 213 09/30/2023    BMET    Component Value Date/Time   NA  136 09/30/2023 1121   NA 138 03/26/2015 0804   NA 136 07/28/2014 0952   K 4.1 09/30/2023  1121   K 3.0 (L) 07/28/2014 0952   CL 103 09/30/2023 1121   CL 102 07/28/2014 0952   CO2 21 (L) 09/30/2023 1121   CO2 24 07/28/2014 0952   GLUCOSE 284 (H) 09/30/2023 1121   GLUCOSE 205 (H) 07/28/2014 0952   BUN 13 09/30/2023 1121   BUN 10 03/26/2015 0804   BUN 11 07/28/2014 0952   CREATININE 0.53 09/30/2023 1121   CREATININE 0.65 12/14/2015 1246   CALCIUM 8.9 09/30/2023 1121   CALCIUM 9.3 07/28/2014 0952   GFRNONAA >60 09/30/2023 1121   GFRNONAA >89 12/14/2015 1246   GFRAA >89 12/14/2015 1246   Estimated Creatinine Clearance: 101.4 mL/min (by C-G formula based on SCr of 0.53 mg/dL).  COAG Lab Results  Component Value Date   INR 1.1 09/30/2023   INR 0.8 06/25/2012    Radiology US Venous Img Lower Bilateral (DVT) Result Date: 09/30/2023 CLINICAL DATA:  Pulmonary embolism. EXAM: BILATERAL LOWER EXTREMITY VENOUS DOPPLER ULTRASOUND TECHNIQUE: Kenneshia Rehm-scale sonography with graded compression, as well as color Doppler and duplex ultrasound were performed to evaluate the lower extremity deep venous systems from the level of the common femoral vein and including the common femoral, femoral, profunda femoral, popliteal and calf veins including the posterior tibial, peroneal and gastrocnemius veins when visible. The superficial great saphenous vein was also interrogated. Spectral Doppler was utilized to evaluate flow at rest and with distal augmentation maneuvers in the common femoral, femoral and popliteal veins. COMPARISON:  None Available. FINDINGS: RIGHT LOWER EXTREMITY Common Femoral Vein: No evidence of thrombus. Normal compressibility, respiratory phasicity and response to augmentation. Saphenofemoral Junction: No evidence of thrombus. Normal compressibility and flow on color Doppler imaging. Profunda Femoral Vein: No evidence of thrombus. Normal compressibility and flow on color Doppler imaging. Femoral Vein: No evidence of thrombus. Normal compressibility, respiratory phasicity and response  to augmentation. Popliteal Vein: Nonocclusive thrombus. Calf Veins: No evidence of thrombus. Normal compressibility and flow on color Doppler imaging. Superficial Great Saphenous Vein: No evidence of thrombus. Normal compressibility. Other Findings: Small amount of fluid in the popliteal fossa typical of Baker cyst. LEFT LOWER EXTREMITY Common Femoral Vein: No evidence of thrombus. Normal compressibility, respiratory phasicity and response to augmentation. Saphenofemoral Junction: No evidence of thrombus. Normal compressibility and flow on color Doppler imaging. Profunda Femoral Vein: No evidence of thrombus. Normal compressibility and flow on color Doppler imaging. Femoral Vein: No evidence of thrombus. Normal compressibility, respiratory phasicity and response to augmentation. Popliteal Vein: No evidence of thrombus. Normal compressibility, respiratory phasicity and response to augmentation. Calf Veins: No evidence of thrombus. Normal compressibility and flow on color Doppler imaging. Superficial Great Saphenous Vein: No evidence of thrombus. Normal compressibility. IMPRESSION: 1. Nonocclusive thrombus within the right popliteal vein. 2. No evidence of left lower extremity DVT. Electronically Signed   By: Narda Rutherford M.D.   On: 09/30/2023 18:49   CT Angio Chest PE W and/or Wo Contrast Result Date: 09/30/2023 CLINICAL DATA:  Shortness of breath. Concern for pulmonary embolism. EXAM: CT ANGIOGRAPHY CHEST WITH CONTRAST TECHNIQUE: Multidetector CT imaging of the chest was performed using the standard protocol during bolus administration of intravenous contrast. Multiplanar CT image reconstructions and MIPs were obtained to evaluate the vascular anatomy. RADIATION DOSE REDUCTION: This exam was performed according to the departmental dose-optimization program which includes automated exposure control, adjustment of the mA and/or kV according to patient size and/or  use of iterative reconstruction technique.  CONTRAST:  75mL OMNIPAQUE IOHEXOL 350 MG/ML SOLN COMPARISON:  Chest radiograph dated 09/30/2023 and CT dated 08/12/2021. FINDINGS: Cardiovascular: There is no cardiomegaly or pericardial effusion. There is dilatation of the right heart chambers with RV/LV ratio of approximately 1.4 indicative of right heart straining. The thoracic aorta is unremarkable. Bilateral pulmonary artery emboli involving the lobar and segmental branches of the lower lobes and upper lobes. Mediastinum/Nodes: No hilar or mediastinal adenopathy. The esophagus is grossly unremarkable. No mediastinal fluid collection. Lungs/Pleura: No focal consolidation, pleural effusion, pneumothorax. The central airways are patent. Upper Abdomen: Fatty liver.  Cholecystectomy. Musculoskeletal: No acute osseous pathology. Review of the MIP images confirms the above findings. IMPRESSION: Positive for acute PE with CT evidence of right heart strain (RV/LV Ratio = 1.4) consistent with at least submassive (intermediate risk) PE. The presence of right heart strain has been associated with an increased risk of morbidity and mortality. Please refer to the "Code PE Focused" order set in EPIC. These results were called by telephone at the time of interpretation on 09/30/2023 at 3:12 pm to Dr. Sedalia Muta, who verbally acknowledged these results. Electronically Signed   By: Elgie Collard M.D.   On: 09/30/2023 15:34   DG Chest 2 View Result Date: 09/30/2023 CLINICAL DATA:  Shortness of breath EXAM: CHEST - 2 VIEW COMPARISON:  06/26/2021 x-ray.  CT 08/12/2021 FINDINGS: The heart size and mediastinal contours are within normal limits. No consolidation, pneumothorax or effusion. No edema. The visualized skeletal structures are unremarkable. IMPRESSION: No acute cardiopulmonary disease. Electronically Signed   By: Karen Kays M.D.   On: 09/30/2023 12:02      Assessment/Plan 1. CTA-PE shows segmental/subsegmental PE in bilateral upper and and lower and a laminated area of  thrombus in the main pulmonary artery to the patient right.  RV/LV ratio is 1.4 c/w strain.  Minimal right popliteal non-occlusive (possibly chronic) DVT change.  LLE clean.  Because she has no rest symptoms I think she can decide if she wants to pursue Pulmonary Thrombectomy or just go with anti-coagulation.  We asked for a STAT ECHO today at 3 pm but it was not done.  We will continue heparin and re-discuss with her tomorrow once the ECHO is resulted and see how she feels and what she wants to do.   Iline Oven, MD  09/30/2023 7:56 PM

## 2023-09-30 NOTE — ED Triage Notes (Signed)
 Pt from [facility] due to low oxygen levels. Pt c/o chest tightness, dry cough, shob that is worse w/ exertion, dizziness w/ exertion, and high blood sugar at home x3-4 days. Pt states she's flu positive and hasn't taken meds for DM due to insurance issue. Pt is AOX4, respirations even, labored, lung sounds clear bilaterally.

## 2023-10-01 ENCOUNTER — Inpatient Hospital Stay
Admit: 2023-10-01 | Discharge: 2023-10-01 | Disposition: A | Discharge status: Home / Self Care | Attending: Internal Medicine

## 2023-10-01 DIAGNOSIS — I214 Non-ST elevation (NSTEMI) myocardial infarction: Secondary | ICD-10-CM | POA: Diagnosis not present

## 2023-10-01 LAB — ECHOCARDIOGRAM COMPLETE
AR max vel: 1.9 cm2
AV Peak grad: 9.9 mmHg
Ao pk vel: 1.57 m/s
Area-P 1/2: 3.68 cm2
Height: 67.008 in
S' Lateral: 2.5 cm
Weight: 4222.25 [oz_av]

## 2023-10-01 LAB — GASTROINTESTINAL PANEL BY PCR, STOOL (REPLACES STOOL CULTURE)

## 2023-10-01 LAB — BASIC METABOLIC PANEL WITH GFR
Anion gap: 9 (ref 5–15)
BUN: 13 mg/dL (ref 6–20)
CO2: 23 mmol/L (ref 22–32)
Calcium: 8.2 mg/dL — ABNORMAL LOW (ref 8.9–10.3)
Chloride: 106 mmol/L (ref 98–111)
Creatinine, Ser: 0.57 mg/dL (ref 0.44–1.00)
GFR, Estimated: >60 mL/min (ref >60)
Glucose, Bld: 182 mg/dL — ABNORMAL HIGH (ref 70–99)
Potassium: 3.3 mmol/L — ABNORMAL LOW (ref 3.5–5.1)
Sodium: 138 mmol/L (ref 135–145)

## 2023-10-01 LAB — GLUCOSE, CAPILLARY
Glucose-Capillary: 184 mg/dL — ABNORMAL HIGH (ref 70–99)
Glucose-Capillary: 161 mg/dL — ABNORMAL HIGH (ref 70–99)
Glucose-Capillary: 134 mg/dL — ABNORMAL HIGH (ref 70–99)
Glucose-Capillary: 194 mg/dL — ABNORMAL HIGH (ref 70–99)

## 2023-10-01 LAB — C DIFFICILE QUICK SCREEN W PCR REFLEX
C Diff antigen: NEGATIVE
C Diff interpretation: NOT DETECTED
C Diff toxin: NEGATIVE

## 2023-10-01 LAB — CBC
HCT: 39.4 % (ref 36.0–46.0)
Hemoglobin: 12.9 g/dL (ref 12.0–15.0)
MCH: 26.4 pg (ref 26.0–34.0)
MCHC: 32.7 g/dL (ref 30.0–36.0)
MCV: 80.7 fL (ref 80.0–100.0)
Platelets: 197 10*3/uL (ref 150–400)
RBC: 4.88 MIL/uL (ref 3.87–5.11)
RDW: 13.7 % (ref 11.5–15.5)
WBC: 6.1 10*3/uL (ref 4.0–10.5)
nRBC: 0 % (ref 0.0–0.2)

## 2023-10-01 LAB — HEPARIN LEVEL (UNFRACTIONATED)
Heparin Unfractionated: 0.28 [IU]/mL — ABNORMAL LOW (ref 0.30–0.70)
Heparin Unfractionated: 0.34 [IU]/mL (ref 0.30–0.70)
Heparin Unfractionated: 0.36 [IU]/mL (ref 0.30–0.70)

## 2023-10-01 MED ORDER — POTASSIUM CHLORIDE CRYS ER 20 MEQ PO TBCR
40.0000 meq | EXTENDED_RELEASE_TABLET | Freq: Once | ORAL | Status: AC
  Administered 2023-10-01: 40 meq via ORAL
  Filled 2023-10-01: qty 2

## 2023-10-01 MED ORDER — DAPAGLIFLOZIN PROPANEDIOL 10 MG PO TABS
10.0000 mg | ORAL_TABLET | Freq: Every day | ORAL | Status: DC
  Administered 2023-10-01 – 2023-10-03 (×3): 10 mg via ORAL
  Filled 2023-10-01 (×3): qty 1

## 2023-10-01 MED ORDER — ATORVASTATIN CALCIUM 10 MG PO TABS
10.0000 mg | ORAL_TABLET | Freq: Every day | ORAL | Status: DC
  Administered 2023-10-01 – 2023-10-03 (×3): 10 mg via ORAL
  Filled 2023-10-01 (×3): qty 1

## 2023-10-01 MED ORDER — PANTOPRAZOLE SODIUM 40 MG PO TBEC
40.0000 mg | DELAYED_RELEASE_TABLET | Freq: Every day | ORAL | Status: DC
  Administered 2023-10-01 – 2023-10-03 (×3): 40 mg via ORAL
  Filled 2023-10-01 (×3): qty 1

## 2023-10-01 MED ORDER — LOSARTAN POTASSIUM 25 MG PO TABS
25.0000 mg | ORAL_TABLET | Freq: Every day | ORAL | Status: DC
  Administered 2023-10-01: 25 mg via ORAL
  Filled 2023-10-01 (×2): qty 1

## 2023-10-01 MED ORDER — HEPARIN BOLUS VIA INFUSION
2600.0000 [IU] | Freq: Once | INTRAVENOUS | Status: AC
  Administered 2023-10-01: 2600 [IU] via INTRAVENOUS
  Filled 2023-10-01: qty 2600

## 2023-10-01 NOTE — Progress Notes (Signed)
 PHARMACY - ANTICOAGULATION CONSULT NOTE  Pharmacy Consult for Heparin Infusion Indication: chest pain/ACS--> PE  Allergies  Allergen Reactions   Clindamycin/Lincomycin Swelling    Throat swelling   Amoxicillin-Pot Clavulanate Diarrhea   Cefuroxime Axetil Diarrhea   Pravastatin Diarrhea    Patient Measurements: Height: 5' 7.01" (170.2 cm) Weight: 119.7 kg (263 lb 14.3 oz) IBW/kg (Calculated) : 61.62 HEPARIN DW (KG): 89.8  Vital Signs: Temp: 97.5 F (36.4 C) (04/06 1209) Temp Source: Oral (04/06 1209) BP: 157/84 (04/06 1209) Pulse Rate: 84 (04/06 1209)  Labs: Recent Labs    09/30/23 1121 09/30/23 1306 09/30/23 1526 09/30/23 1528 09/30/23 2149 10/01/23 0545 10/01/23 1439  HGB 14.5  --   --   --   --  12.9  --   HCT 45.1  --   --   --   --  39.4  --   PLT 213  --   --   --   --  197  --   APTT  --   --   --  61*  --   --   --   LABPROT  --   --   --  14.5  --   --   --   INR  --   --   --  1.1  --   --   --   HEPARINUNFRC  --   --    < >  --  <0.10* 0.28* 0.34  CREATININE 0.53  --   --   --   --  0.57  --   TROPONINIHS 108* 146*  --   --  106*  --   --    < > = values in this interval not displayed.    Estimated Creatinine Clearance: 101.4 mL/min (by C-G formula based on SCr of 0.57 mg/dL).   Medical History: Past Medical History:  Diagnosis Date   Anxiety    Arthritis    Bilateral lower extremity edema    Cancer (HCC) 2019   melanoma   Cardiac murmur    Diabetes mellitus without complication (HCC)    Pt takes Metformin and glipizide   Diverticulitis of colon    GERD (gastroesophageal reflux disease)    Hepatic steatosis 12/22/2015   Morbid obesity (HCC) 03/17/2015   Obesity, Class II, BMI 35.0-39.9, with comorbidity (see actual BMI)     Medications:  Not on anticoagulation per chart review  Assessment: Patient is a 58 year old female that presented to ED with chief complaint of chest tightness, dry cough, shortness of breath that is worse with  exertion, dizziness with exertion, and high blood sugar. EKG showed sinus tachycardia with a rate of 114 without any ST elevation of T wave inversions except for V3 per ED provider. Troponin elevated at 108>146. Pharmacy has been consulted to initiate patient on a heparin infusion for ACS/STEMI--> changed to PE based on CT imaging: Positive for acute PE with CT evidence of right heart strain (RV/LV Ratio = 1.4) consistent with at least submassive (intermediate risk) PE.   4/6 0545 HL 0.28  4/6 1439 HL 0.34  Goal of Therapy:  Heparin level 0.3-0.7 units/ml Monitor platelets by anticoagulation protocol: Yes    Plan:  Heparin level is therapeutic x 1.Continue the heparin infusion at 1500 units/hr. Recheck heparin level in 6 hours. CBC daily while on heparin.   Paulita Fujita, PharmD Clinical Pharmacist  10/01/2023 3:23 PM

## 2023-10-01 NOTE — Progress Notes (Signed)
 [facility]  Daily Progress Note   Subjective  -   She developed diarrhea last night and had been 10 times to [place].  C/O SOB with these trips which she does not think is normal (the SOB).  Sats are >96 on RA and no symptoms except when ambulating.  No CP.  No fever.  No cough.  Objective Vitals:   09/30/23 2334 10/01/23 0515 10/01/23 0820 10/01/23 1209  BP: (!) 150/81 (!) 141/73 (!) 159/79 (!) 157/84  Pulse: 85 80 88 84  Resp: 20 18 16 15   Temp: (!) 97.4 F (36.3 C) 98.7 F (37.1 C) (!) 97.4 F (36.3 C) (!) 97.5 F (36.4 C)  TempSrc:   Oral Oral  SpO2: 96% 98% 92% 97%  Weight:      Height:        Intake/Output Summary (Last 24 hours) at 10/01/2023 1354 Last data filed at 10/01/2023 1030 Gross per 24 hour  Intake 218.41 ml  Output --  Net 218.41 ml    PULM  CTAB CV  RRR VASC  normal  Laboratory CBC    Component Value Date/Time   WBC 6.1 10/01/2023 0545   HGB 12.9 10/01/2023 0545   HGB 13.7 03/26/2015 0804   HCT 39.4 10/01/2023 0545   HCT 39.8 03/26/2015 0804   PLT 197 10/01/2023 0545   PLT 304 03/26/2015 0804    BMET    Component Value Date/Time   NA 138 10/01/2023 0545   NA 138 03/26/2015 0804   NA 136 07/28/2014 0952   K 3.3 (L) 10/01/2023 0545   K 3.0 (L) 07/28/2014 0952   CL 106 10/01/2023 0545   CL 102 07/28/2014 0952   CO2 23 10/01/2023 0545   CO2 24 07/28/2014 0952   GLUCOSE 182 (H) 10/01/2023 0545   GLUCOSE 205 (H) 07/28/2014 0952   BUN 13 10/01/2023 0545   BUN 10 03/26/2015 0804   BUN 11 07/28/2014 0952   CREATININE 0.57 10/01/2023 0545   CREATININE 0.65 12/14/2015 1246   CALCIUM 8.2 (L) 10/01/2023 0545   CALCIUM 9.3 07/28/2014 0952   GFRNONAA >60 10/01/2023 0545   GFRNONAA >89 12/14/2015 1246   GFRAA >89 12/14/2015 1246    Assessment/Planning: HD #2 s/p admission for PE with likely onset of PE about a week ago  Her ECHO today shows a normal right ventricle and RA as well as the rest of the heart.  No sign of  right heart dysfunction.  Only the SOB with exertion suggests PE residual symptoms.  Overlay is diarrhea and prior positive flu screen. No plan for pulmonary thrombectomy.  If SOB persists would recommend repeat CTA-PE to see what it looks like now.  It is a surprise that the ECHO would be so normal a day after the CTA looked significant and her enzymes were positive. Continue heparin with ultimate plan to convert to oral regimen once any thought of pulmonary thrombectomy has been resolved.  Iline Oven  10/01/2023, 1:54 PM

## 2023-10-01 NOTE — Plan of Care (Signed)
  Problem: Education: Goal: Ability to describe self-care measures that may prevent or decrease complications (Diabetes Survival Skills Education) will improve Outcome: Progressing Goal: Individualized Educational Video(s) Outcome: Progressing   Problem: Coping: Goal: Ability to adjust to condition or change in health will improve Outcome: Progressing   Problem: Fluid Volume: Goal: Ability to maintain a balanced intake and output will improve Outcome: Progressing   Problem: Health Behavior/Discharge Planning: Goal: Ability to identify and utilize available resources and services will improve Outcome: Progressing Goal: Ability to manage health-related needs will improve Outcome: Progressing   Problem: Metabolic: Goal: Ability to maintain appropriate glucose levels will improve Outcome: Progressing   Problem: Nutritional: Goal: Maintenance of adequate nutrition will improve Outcome: Progressing Goal: Progress toward achieving an optimal weight will improve Outcome: Progressing   Problem: Skin Integrity: Goal: Risk for impaired skin integrity will decrease Outcome: Progressing   Problem: Tissue Perfusion: Goal: Adequacy of tissue perfusion will improve Outcome: Progressing   Problem: Education: Goal: Knowledge of General Education information will improve Description: Including pain rating scale, medication(s)/side effects and non-pharmacologic comfort measures Outcome: Progressing   Problem: Health Behavior/Discharge Planning: Goal: Ability to manage health-related needs will improve Outcome: Progressing   Problem: Clinical Measurements: Goal: Ability to maintain clinical measurements within normal limits will improve Outcome: Progressing Goal: Will remain free from infection Outcome: Progressing Goal: Diagnostic test results will improve Outcome: Progressing Goal: Respiratory complications will improve Outcome: Progressing Goal: Cardiovascular complication will  be avoided Outcome: Progressing   Problem: Activity: Goal: Risk for activity intolerance will decrease Outcome: Progressing   Problem: Nutrition: Goal: Adequate nutrition will be maintained Outcome: Progressing   Problem: Coping: Goal: Level of anxiety will decrease Outcome: Progressing   Problem: Elimination: Goal: Will not experience complications related to bowel motility Outcome: Progressing Goal: Will not experience complications related to urinary retention Outcome: Progressing   Problem: Pain Managment: Goal: General experience of comfort will improve and/or be controlled Outcome: Progressing   Problem: Safety: Goal: Ability to remain free from injury will improve Outcome: Progressing   Problem: Skin Integrity: Goal: Risk for impaired skin integrity will decrease Outcome: Progressing   Problem: Education: Goal: Understanding of cardiac disease, CV risk reduction, and recovery process will improve Outcome: Progressing Goal: Understanding of medication regimen will improve Outcome: Progressing Goal: Individualized Educational Video(s) Outcome: Progressing   Problem: Activity: Goal: Ability to tolerate increased activity will improve Outcome: Progressing   Problem: Cardiac: Goal: Ability to achieve and maintain adequate cardiopulmonary perfusion will improve Outcome: Progressing Goal: Vascular access site(s) Level 0-1 will be maintained Outcome: Progressing   Problem: Health Behavior/Discharge Planning: Goal: Ability to safely manage health-related needs after discharge will improve Outcome: Progressing

## 2023-10-01 NOTE — Progress Notes (Signed)
 PHARMACY - ANTICOAGULATION CONSULT NOTE  Pharmacy Consult for Heparin Infusion Indication: chest pain/ACS--> PE  Allergies  Allergen Reactions   Clindamycin/Lincomycin Swelling    Throat swelling   Amoxicillin-Pot Clavulanate Diarrhea   Cefuroxime Axetil Diarrhea   Pravastatin Diarrhea    Patient Measurements: Height: 5' 7.01" (170.2 cm) Weight: 119.7 kg (263 lb 14.3 oz) IBW/kg (Calculated) : 61.62 HEPARIN DW (KG): 89.8  Vital Signs: Temp: 98.3 F (36.8 C) (04/06 1933) Temp Source: Oral (04/06 1628) BP: 150/80 (04/06 1933) Pulse Rate: 90 (04/06 1933)  Labs: Recent Labs    09/30/23 1121 09/30/23 1306 09/30/23 1526 09/30/23 1528 09/30/23 2149 10/01/23 0545 10/01/23 1439 10/01/23 2035  HGB 14.5  --   --   --   --  12.9  --   --   HCT 45.1  --   --   --   --  39.4  --   --   PLT 213  --   --   --   --  197  --   --   APTT  --   --   --  61*  --   --   --   --   LABPROT  --   --   --  14.5  --   --   --   --   INR  --   --   --  1.1  --   --   --   --   HEPARINUNFRC  --   --    < >  --  <0.10* 0.28* 0.34 0.36  CREATININE 0.53  --   --   --   --  0.57  --   --   TROPONINIHS 108* 146*  --   --  106*  --   --   --    < > = values in this interval not displayed.    Estimated Creatinine Clearance: 101.4 mL/min (by C-G formula based on SCr of 0.57 mg/dL).   Medical History: Past Medical History:  Diagnosis Date   Anxiety    Arthritis    Bilateral lower extremity edema    Cancer (HCC) 2019   melanoma   Cardiac murmur    Diabetes mellitus without complication (HCC)    Pt takes Metformin and glipizide   Diverticulitis of colon    GERD (gastroesophageal reflux disease)    Hepatic steatosis 12/22/2015   Morbid obesity (HCC) 03/17/2015   Obesity, Class II, BMI 35.0-39.9, with comorbidity (see actual BMI)     Medications:  Not on anticoagulation per chart review  Assessment: Patient is a 58 year old female that presented to ED with chief complaint of chest  tightness, dry cough, shortness of breath that is worse with exertion, dizziness with exertion, and high blood sugar. EKG showed sinus tachycardia with a rate of 114 without any ST elevation of T wave inversions except for V3 per ED provider. Troponin elevated at 108>146. Pharmacy has been consulted to initiate patient on a heparin infusion for ACS/STEMI--> changed to PE based on CT imaging: Positive for acute PE with CT evidence of right heart strain (RV/LV Ratio = 1.4) consistent with at least submassive (intermediate risk) PE.   4/6 0545 HL 0.28  4/6 1439 HL 0.34 4/6 2035 HL 0.36  Goal of Therapy:  Heparin level 0.3-0.7 units/ml Monitor platelets by anticoagulation protocol: Yes    Plan:  Heparin level is therapeutic x 2.Continue the heparin infusion at 1500 units/hr. Transition to daily monitoring of  heparin level. CBC daily while on heparin.   Paulita Fujita, PharmD Clinical Pharmacist  10/01/2023 9:02 PM

## 2023-10-01 NOTE — Progress Notes (Signed)
 PHARMACY - ANTICOAGULATION CONSULT NOTE  Pharmacy Consult for Heparin Infusion Indication: chest pain/ACS--> PE  Allergies  Allergen Reactions   Clindamycin/Lincomycin Swelling    Throat swelling   Amoxicillin-Pot Clavulanate Diarrhea   Cefuroxime Axetil Diarrhea   Pravastatin Diarrhea    Patient Measurements: Height: 5' 7.01" (170.2 cm) Weight: 119.7 kg (263 lb 14.3 oz) IBW/kg (Calculated) : 61.62 HEPARIN DW (KG): 89.8  Vital Signs: Temp: 98.7 F (37.1 C) (04/06 0515) Temp Source: Oral (04/05 2106) BP: 141/73 (04/06 0515) Pulse Rate: 80 (04/06 0515)  Labs: Recent Labs    09/30/23 1121 09/30/23 1306 09/30/23 1526 09/30/23 1528 09/30/23 2149 10/01/23 0545  HGB 14.5  --   --   --   --  12.9  HCT 45.1  --   --   --   --  39.4  PLT 213  --   --   --   --  197  APTT  --   --   --  61*  --   --   LABPROT  --   --   --  14.5  --   --   INR  --   --   --  1.1  --   --   HEPARINUNFRC  --   --  0.36  --  <0.10* 0.28*  CREATININE 0.53  --   --   --   --  0.57  TROPONINIHS 108* 146*  --   --  106*  --     Estimated Creatinine Clearance: 101.4 mL/min (by C-G formula based on SCr of 0.57 mg/dL).   Medical History: Past Medical History:  Diagnosis Date   Anxiety    Arthritis    Bilateral lower extremity edema    Cancer (HCC) 2019   melanoma   Cardiac murmur    Diabetes mellitus without complication (HCC)    Pt takes Metformin and glipizide   Diverticulitis of colon    GERD (gastroesophageal reflux disease)    Hepatic steatosis 12/22/2015   Morbid obesity (HCC) 03/17/2015   Obesity, Class II, BMI 35.0-39.9, with comorbidity (see actual BMI)     Medications:  Not on anticoagulation per chart review  Assessment: Patient is a 58 year old female that presented to ED with chief complaint of chest tightness, dry cough, shortness of breath that is worse with exertion, dizziness with exertion, and high blood sugar. EKG showed sinus tachycardia with a rate of 114  without any ST elevation of T wave inversions except for V3 per ED provider. Troponin elevated at 108>146. Pharmacy has been consulted to initiate patient on a heparin infusion for ACS/STEMI--> changed to PE based on CT imaging: Positive for acute PE with CT evidence of right heart strain (RV/LV Ratio = 1.4) consistent with at least submassive (intermediate risk) PE.   4/6 0545 HL 0.28   Goal of Therapy:  Heparin level 0.3-0.7 units/ml Monitor platelets by anticoagulation protocol: Yes    Plan:  Heparin level is subtherapeutic. Heparin bolus of 2600 units x 1 and increase the heparin infusion to 1500 units/hr. Recheck heparin level in 6 hours. CBC daily while on heparin.   Paschal Dopp, PharmD, 10/01/2023 7:38 AM

## 2023-10-01 NOTE — Progress Notes (Signed)
 Enteric precautions dc'd per protocol. Patients cdiff and GI panel negative. Will continue to monitor.

## 2023-10-01 NOTE — Progress Notes (Signed)
 Progress Note   Patient: Jessica Tran MWN:[id number] DOB: [birth year] DOA: 09/30/2023     1 DOS: the patient was seen and examined on 10/01/2023   Brief hospital course: "Ms. Jessica Tran is a 58 year old female with history of truncal obesity, hyperlipidemia, insulin-dependent diabetes mellitus, anxiety, who presents emergency department for chief concerns of chest tightness, shortness of breath, dry cough for the past week. ..." See H&P for full HPI on admission & ED course.  Patient was found to have acute bilateral pulmonary emboli with CTA showing signs of right-heart strain.  Patient admitted and started on IV heparin.   Vascular Surgery was consulted.   Further hospital course and management as outlined below.   Assessment and Plan:  NSTEMI vs Myocardia Injury due to Demand Ischemia Troponin trend: 108 >> 146 >> 106.  EKG non-acute. No ischemic chest pain. --Echo is pending --On IV heparin for Pe's as below  Bilateral pulmonary embolism  With CTA read of right heart strain, RV to LV ratio is 1.4 --Continue heparin drip --Vascular Surgery following, recommends continuing to monitor, so far no intervention is planned --Will transition to Eliquis likely tomorrow if pt stable and continuing to improve  Lactic acidosis -- unlikely infection based on lack of infectious symptoms. Pt recently had flu but symptoms have resolved. --Trend lactate  Hypokalemia - K 3.3 --40 mEq PO K-Cl to replace --BMP in AM  Anxiety state --Ativan PRN ordered on admission --Pt on PRN low dose Xanax at home  Obesity, class III Body mass index is 41.32 kg/m. Complicates overall care and prognosis.  Recommend lifestyle modifications including physical activity and diet for weight loss and overall long-term health.  Hepatic steatosis Per Dr. Sedalia Muta: "Extensive discussion with patient at bedside for working with her health insurance to ensure that she gets the appropriate diabetes mellitus  medication and follow-up with primary care provider for guidance regarding healthy weight loss Diet counseling: Encourage patient to eat lean protein with steamed vegetables and avoid high carb food items especially highly processed foods.  Patient endorses understanding and states she will try her best."        Subjective: Pt up in recliner when seen this AM, sister at bedside.  Pt reports some exertional dyspnea when ambulating to bathroom.  No chest pain.  Pt reports having diarrhea that is new and requesting something for it.  No fever/chills, abdominal pain or N/V.  Physical Exam: Vitals:   09/30/23 2334 10/01/23 0515 10/01/23 0820 10/01/23 1209  BP: (!) 150/81 (!) 141/73 (!) 159/79 (!) 157/84  Pulse: 85 80 88 84  Resp: 20 18 16 15   Temp: (!) 97.4 F (36.3 C) 98.7 F (37.1 C) (!) 97.4 F (36.3 C) (!) 97.5 F (36.4 C)  TempSrc:   Oral Oral  SpO2: 96% 98% 92% 97%  Weight:      Height:       General exam: awake, alert, no acute distress HEENT: moist mucus membranes, hearing grossly normal  Respiratory system: CTA, no wheezes, rales or rhonchi, normal respiratory effort. Cardiovascular system: normal S1/S2, RRR,no pedal edema.   Gastrointestinal system: soft, NT, ND Central nervous system: A&O x 3. no gross focal neurologic deficits, normal speech Extremities: moves all, no edema, normal tone Skin: dry, intact, normal temperature Psychiatry: normal mood, congruent affect, judgement and insight appear normal   Data Reviewed:  Notable labs --  Lactic acid trend 2.6 >> 2.4    Family Communication: sister at bedside on rounds  Disposition: Status  is: Inpatient Remains inpatient appropriate because: remains on IV heparin and weaning oxygen   Planned Discharge Destination: Home    Time spent: 45 minutes  Author: Pennie Banter, DO 10/01/2023 3:02 PM  For on call review [email or web address].

## 2023-10-02 ENCOUNTER — Telehealth (HOSPITAL_COMMUNITY): Payer: Self-pay | Admitting: Pharmacy Technician

## 2023-10-02 ENCOUNTER — Inpatient Hospital Stay

## 2023-10-02 ENCOUNTER — Other Ambulatory Visit (HOSPITAL_COMMUNITY): Payer: Self-pay

## 2023-10-02 ENCOUNTER — Other Ambulatory Visit: Payer: Self-pay

## 2023-10-02 DIAGNOSIS — I214 Non-ST elevation (NSTEMI) myocardial infarction: Secondary | ICD-10-CM | POA: Diagnosis not present

## 2023-10-02 LAB — HEPARIN LEVEL (UNFRACTIONATED): Heparin Unfractionated: 0.5 [IU]/mL (ref 0.30–0.70)

## 2023-10-02 LAB — GLUCOSE, CAPILLARY
Glucose-Capillary: 158 mg/dL — ABNORMAL HIGH (ref 70–99)
Glucose-Capillary: 138 mg/dL — ABNORMAL HIGH (ref 70–99)
Glucose-Capillary: 177 mg/dL — ABNORMAL HIGH (ref 70–99)
Glucose-Capillary: 204 mg/dL — ABNORMAL HIGH (ref 70–99)

## 2023-10-02 LAB — CBC
HCT: 43.4 % (ref 36.0–46.0)
Hemoglobin: 14.1 g/dL (ref 12.0–15.0)
MCH: 26.5 pg (ref 26.0–34.0)
MCHC: 32.5 g/dL (ref 30.0–36.0)
MCV: 81.4 fL (ref 80.0–100.0)
Platelets: 230 10*3/uL (ref 150–400)
RBC: 5.33 MIL/uL — ABNORMAL HIGH (ref 3.87–5.11)
RDW: 13.8 % (ref 11.5–15.5)
WBC: 8.8 10*3/uL (ref 4.0–10.5)
nRBC: 0 % (ref 0.0–0.2)

## 2023-10-02 LAB — HEMOGLOBIN A1C
Hgb A1c MFr Bld: 9.4 % — ABNORMAL HIGH (ref 4.8–5.6)
Mean Plasma Glucose: 223 mg/dL

## 2023-10-02 LAB — LACTIC ACID, PLASMA: Lactic Acid, Venous: 1.4 mmol/L (ref 0.5–1.9)

## 2023-10-02 MED ORDER — IOHEXOL 350 MG/ML SOLN
75.0000 mL | Freq: Once | INTRAVENOUS | Status: AC | PRN
  Administered 2023-10-02: 75 mL via INTRAVENOUS

## 2023-10-02 MED ORDER — APIXABAN (ELIQUIS) VTE STARTER PACK (10MG AND 5MG)
ORAL_TABLET | ORAL | 0 refills | Status: AC
  Filled 2023-10-02: qty 74, 28d supply, fill #0

## 2023-10-02 MED ORDER — ENOXAPARIN SODIUM 120 MG/0.8ML IJ SOSY: 1.0000 mg/kg | PREFILLED_SYRINGE | Freq: Two times a day (BID) | INTRAMUSCULAR | Status: DC

## 2023-10-02 MED ORDER — DABIGATRAN ETEXILATE MESYLATE 150 MG PO CAPS: 150.0000 mg | ORAL_CAPSULE | Freq: Two times a day (BID) | ORAL | Status: DC

## 2023-10-02 MED ORDER — LOSARTAN POTASSIUM 50 MG PO TABS
50.0000 mg | ORAL_TABLET | Freq: Every day | ORAL | Status: DC
  Administered 2023-10-02: 50 mg via ORAL
  Filled 2023-10-02: qty 1

## 2023-10-02 MED ORDER — APIXABAN 5 MG PO TABS: 5.0000 mg | ORAL_TABLET | Freq: Two times a day (BID) | ORAL | Status: DC

## 2023-10-02 MED ORDER — APIXABAN 5 MG PO TABS
10.0000 mg | ORAL_TABLET | Freq: Two times a day (BID) | ORAL | Status: DC
  Administered 2023-10-02 – 2023-10-03 (×3): 10 mg via ORAL
  Filled 2023-10-02 (×3): qty 2

## 2023-10-02 NOTE — Progress Notes (Signed)
 PHARMACY - ANTICOAGULATION CONSULT NOTE  Pharmacy Consult for Heparin Infusion Indication: chest pain/ACS--> PE  Allergies  Allergen Reactions   Clindamycin/Lincomycin Swelling    Throat swelling   Amoxicillin-Pot Clavulanate Diarrhea   Cefuroxime Axetil Diarrhea   Pravastatin Diarrhea    Patient Measurements: Height: 5' 7.01" (170.2 cm) Weight: 119.7 kg (263 lb 14.3 oz) IBW/kg (Calculated) : 61.62 HEPARIN DW (KG): 89.8  Vital Signs: Temp: 98.2 F (36.8 C) (04/07 0513) Temp Source: Oral (04/07 0513) BP: 155/76 (04/07 0513) Pulse Rate: 64 (04/07 0513)  Labs: Recent Labs    09/30/23 1121 09/30/23 1306 09/30/23 1526 09/30/23 1528 09/30/23 2149 10/01/23 0545 10/01/23 1439 10/01/23 2035 10/02/23 0636  HGB 14.5  --   --   --   --  12.9  --   --  14.1  HCT 45.1  --   --   --   --  39.4  --   --  43.4  PLT 213  --   --   --   --  197  --   --  230  APTT  --   --   --  61*  --   --   --   --   --   LABPROT  --   --   --  14.5  --   --   --   --   --   INR  --   --   --  1.1  --   --   --   --   --   HEPARINUNFRC  --   --    < >  --  <0.10* 0.28* 0.34 0.36 0.50  CREATININE 0.53  --   --   --   --  0.57  --   --   --   TROPONINIHS 108* 146*  --   --  106*  --   --   --   --    < > = values in this interval not displayed.    Estimated Creatinine Clearance: 101.4 mL/min (by C-G formula based on SCr of 0.57 mg/dL).   Medical History: Past Medical History:  Diagnosis Date   Anxiety    Arthritis    Bilateral lower extremity edema    Cancer (HCC) 2019   melanoma   Cardiac murmur    Diabetes mellitus without complication (HCC)    Pt takes Metformin and glipizide   Diverticulitis of colon    GERD (gastroesophageal reflux disease)    Hepatic steatosis 12/22/2015   Morbid obesity (HCC) 03/17/2015   Obesity, Class II, BMI 35.0-39.9, with comorbidity (see actual BMI)     Medications:  Not on anticoagulation per chart review  Assessment: Patient is a 58 year old  female that presented to ED with chief complaint of chest tightness, dry cough, shortness of breath that is worse with exertion, dizziness with exertion, and high blood sugar. EKG showed sinus tachycardia with a rate of 114 without any ST elevation of T wave inversions except for V3 per ED provider. Troponin elevated at 108>146. Pharmacy has been consulted to initiate patient on a heparin infusion for ACS/STEMI--> changed to PE based on CT imaging: Positive for acute PE with CT evidence of right heart strain (RV/LV Ratio = 1.4) consistent with at least submassive (intermediate risk) PE.   4/6 0545 HL 0.28  4/6 1439 HL 0.34 4/6 2035 HL 0.36 4/7 0636 HL 0.5   Goal of Therapy:  Heparin level 0.3-0.7 units/ml Monitor platelets  by anticoagulation protocol: Yes    Plan:  Heparin level is therapeutic.Continue the heparin infusion at 1500 units/hr. Transition to daily monitoring of heparin level. CBC daily while on heparin.   Paschal Dopp, PharmD Clinical Pharmacist  10/02/2023 7:51 AM

## 2023-10-02 NOTE — Progress Notes (Signed)
 Progress Note    10/02/2023 10:07 AM * No surgery found *  Subjective: Jessica Tran is a 58 year old female with history of truncal obesity, hyperlipidemia, insulin-dependent diabetes mellitus, anxiety, presented to [facility] emergency department with shortness of breath.  Upon workup was found to have a pulmonary embolism.  Vascular old ReSound of bilateral lower extremities was done and patient was found to have a right nonocclusive popliteal thrombus.  She has been placed on a heparin infusion.  On exam this morning she is resting comfortably sitting in the bedside chair.  She is using 1 L of nasal cannula oxygen.  She does endorse having significant exertional shortness of breath as she ambulates back and forth to the bathroom.  No complaints overnight vitals all remained stable.   Vitals:   10/02/23 0513 10/02/23 0752  BP: (!) 155/76 (!) 145/69  Pulse: 64 70  Resp: 15   Temp: 98.2 F (36.8 C) 97.9 F (36.6 C)  SpO2: 99% 99%   Physical Exam: Cardiac:  RRR, normal S1/S2.  No pedal edema.  No rubs clicks or gallops. Lungs: Clear on auscultation throughout.  No rales rhonchi or wheezing.  Normal respiratory effort.  On 1 L nasal cannula  oxygen for support. Incisions: None Extremities: Moves all extremities well.  Warm to touch.  Palpable pulses. Abdomen: Positive bowel sounds throughout, soft, nontender and nondistended. Neurologic: Alert and oriented x 3, answers all questions and follows commands appropriately.  CBC    Component Value Date/Time   WBC 8.8 10/02/2023 0636   RBC 5.33 (H) 10/02/2023 0636   HGB 14.1 10/02/2023 0636   HGB 13.7 03/26/2015 0804   HCT 43.4 10/02/2023 0636   HCT 39.8 03/26/2015 0804   PLT 230 10/02/2023 0636   PLT 304 03/26/2015 0804   MCV 81.4 10/02/2023 0636   MCV 81 03/26/2015 0804   MCV 80 07/28/2014 0952   MCH 26.5 10/02/2023 0636   MCHC 32.5 10/02/2023 0636   RDW 13.8 10/02/2023 0636   RDW 12.8 03/26/2015 0804   RDW 12.9 07/28/2014  0952   LYMPHSABS 1.8 09/30/2023 1121   LYMPHSABS 2.5 03/26/2015 0804   LYMPHSABS 1.6 07/28/2014 0952   MONOABS 0.2 09/30/2023 1121   MONOABS 0.4 07/28/2014 0952   EOSABS 0.0 09/30/2023 1121   EOSABS 0.2 03/26/2015 0804   EOSABS 0.1 07/28/2014 0952   BASOSABS 0.0 09/30/2023 1121   BASOSABS 0.0 03/26/2015 0804   BASOSABS 0.0 07/28/2014 0952    BMET    Component Value Date/Time   NA 138 10/01/2023 0545   NA 138 03/26/2015 0804   NA 136 07/28/2014 0952   K 3.3 (L) 10/01/2023 0545   K 3.0 (L) 07/28/2014 0952   CL 106 10/01/2023 0545   CL 102 07/28/2014 0952   CO2 23 10/01/2023 0545   CO2 24 07/28/2014 0952   GLUCOSE 182 (H) 10/01/2023 0545   GLUCOSE 205 (H) 07/28/2014 0952   BUN 13 10/01/2023 0545   BUN 10 03/26/2015 0804   BUN 11 07/28/2014 0952   CREATININE 0.57 10/01/2023 0545   CREATININE 0.65 12/14/2015 1246   CALCIUM 8.2 (L) 10/01/2023 0545   CALCIUM 9.3 07/28/2014 0952   GFRNONAA >60 10/01/2023 0545   GFRNONAA >89 12/14/2015 1246   GFRAA >89 12/14/2015 1246    INR    Component Value Date/Time   INR 1.1 09/30/2023 1528   INR 0.8 06/25/2012 1059     Intake/Output Summary (Last 24 hours) at 10/02/2023 1007 Last data filed at 10/02/2023  5284 Gross per 24 hour  Intake 314.41 ml  Output --  Net 314.41 ml     Assessment/Plan:  58 y.o. female who presented to [facility] emergency department with chief concerns of chest tightness, shortness of breath, dry cough for the past week.  Upon workup she was noted to have bilateral pulmonary embolisms as well as right lower extremity nonocclusive thrombus.  Patient also underwent an echocardiogram which showed normal right sided heart function.  She was placed on high heparin infusion.  Patient endorses she feels much better today.  She also endorses shortness of breath ambulating back and forth to the bathroom.* No surgery found *   PLAN In discussion with Dr. Esaw Grandchild patient's hospitalist patient will remain on a heparin  infusion for 2 more days and then be converted to Pradaxa for affordable treatment of her pulmonary embolism and right lower extremity DVT.  Long conversation with the patient at the bedside and her sister they agree to this plan.  If the patient's symptoms were to worsen we would recommend repeating the CT chest with PE protocol to reexamine patient's known pulmonary embolism.  We would consider pulmonary thrombectomy at that time.  However for now we will continue the heparin infusion and convert to anticoagulation afterwards to discharge to home if patient's symptoms do not worsen.   DVT prophylaxis: Heparin infusion   Marcie Bal [facility] 10/02/2023 10:07 AM

## 2023-10-02 NOTE — Progress Notes (Addendum)
 Progress Note   Patient: Jessica Tran:[id number] DOB: 1965/09/14 DOA: 09/30/2023     2 DOS: the patient was seen and examined on 10/02/2023   Brief hospital course: "Ms. Jessica Tran is a 58 year old female with history of truncal obesity, hyperlipidemia, insulin-dependent diabetes mellitus, anxiety, who presents emergency department for chief concerns of chest tightness, shortness of breath, dry cough for the past week. ..." See H&P for full HPI on admission & ED course.  Patient was found to have acute bilateral pulmonary emboli with CTA showing signs of right-heart strain.  Patient admitted and started on IV heparin.   Vascular Surgery was consulted.   Further hospital course and management as outlined below.   Assessment and Plan:  NSTEMI vs Myocardia Injury due to Demand Ischemia Troponin trend: 108 >> 146 >> 106.  EKG non-acute. No ischemic chest pain. Echo showed EF 55-60%, grade I DD, normal RV size and function (no strain) --On IV heparin for Pe's as below >> Lovenox  Bilateral pulmonary embolism  With CTA evidence of right heart strain, RV to LV ratio is 1.4 4/6 Echo showed EF 55-60%, grade I DD, normal RV size and function (no strain) --Continue heparin drip >> Lovenox to reduce blood draws --Discussing discharge med options with pharmacy.  Plan to d/c on Pradaxa due to high OOP costs for Eliquis and Xarelto --Vascular Surgery following, have signed off on discharge --Weekend vascular surgery suggested repeating CTA chest if persistent symptoms & pt requests this.  Ordered.   Lactic acidosis -- unlikely infection based on lack of infectious symptoms. Pt recently had flu but symptoms have resolved. --Trend lactate  Hypokalemia - K 3.3 on 4/6 was replaced --BMP in AM  Anxiety state --Ativan PRN ordered on admission --Pt on PRN low dose Xanax at home  Obesity, class III Body mass index is 41.32 kg/m. Complicates overall care and prognosis.  Recommend  lifestyle modifications including physical activity and diet for weight loss and overall long-term health.  Hepatic steatosis Per Dr. Sedalia Muta: "Extensive discussion with patient at bedside for working with her health insurance to ensure that she gets the appropriate diabetes mellitus medication and follow-up with primary care provider for guidance regarding healthy weight loss Diet counseling: Encourage patient to eat lean protein with steamed vegetables and avoid high carb food items especially highly processed foods.  Patient endorses understanding and states she will try her best."        Subjective: Pt up in recliner when seen this AM, sister at bedside.  Pt reports ongoing dyspnea worse with exertion.  No chest pain at rest or exertion.  We discussed medication options for discharge and cost after pharmacy ran out of pocket costs.      Physical Exam: Vitals:   10/01/23 2307 10/02/23 0513 10/02/23 0752 10/02/23 1222  BP: (!) 160/75 (!) 155/76 (!) 145/69 (!) 167/84  Pulse: 79 64 70 78  Resp: 16 15 12    Temp:  98.2 F (36.8 C) 97.9 F (36.6 C) 97.8 F (36.6 C)  TempSrc:  Oral    SpO2: 98% 99% 99% 97%  Weight:      Height:       General exam: awake, alert, no acute distress HEENT: moist mucus membranes, hearing grossly normal  Respiratory system: CTA, no wheezes, rales or rhonchi, normal respiratory effort. Cardiovascular system: normal S1/S2, RRR,no pedal edema.   Gastrointestinal system: soft, NT, ND Central nervous system: A&O x 3. no gross focal neurologic deficits, normal speech Extremities: moves  all, no edema, normal tone Skin: dry, intact, normal temperature Psychiatry: normal mood, congruent affect, judgement and insight appear normal   Data Reviewed:  Notable labs --  Lactic acid trend 2.6 >> 2.4 >> 1.4 Nomral CBC    Family Communication: sister at bedside on rounds  Disposition: Status is: Inpatient Remains inpatient appropriate because: persistent dyspnea  and weaning oxygen   Planned Discharge Destination: Home    Time spent: 45 minutes  Author: Pennie Banter, DO 10/02/2023 1:27 PM  For on call review [email or web address].

## 2023-10-02 NOTE — Inpatient Diabetes Management (Addendum)
 Inpatient Diabetes Program Recommendations  AACE/ADA: New Consensus Statement on Inpatient Glycemic Control (2015)  Target Ranges:  Prepandial:   less than 140 mg/dL      Peak postprandial:   less than 180 mg/dL (1-2 hours)      Critically ill patients:  140 - 180 mg/dL   Lab Results  Component Value Date   GLUCAP 138 (H) 10/02/2023   HGBA1C 9.4 (H) 10/01/2023    Review of Glycemic Control  Latest Reference Range & Units 10/01/23 08:16 10/01/23 12:08 10/01/23 16:18 10/01/23 21:11 10/02/23 07:57 10/02/23 12:23  Glucose-Capillary 70 - 99 mg/dL 161 (H) 096 (H) 045 (H) 194 (H) 158 (H) 138 (H)   Diabetes history: DM  Outpatient Diabetes medications:  Glipzide 20 mg daily Farxiga 10 mg daily Ozempic 0.25 mg weekly (has not started yet) Current orders for Inpatient glycemic control:  Novolog 0-20 units tid with meals and HS Farxiga 10 mg daily Inpatient Diabetes Program Recommendations:    Spoke to patient by phone regarding A1C and she states that she has not started taking Ozempic yet.  She does have it at home.  She was on insulin in the past but no longer takes this.  She states that cost has been prohibitive with her DM medications.  We briefly discussed importance of glycemic control and importance of letting Dr. Val Eagle' Elveria Rising know if she is not able to afford medications.  She plans to start taking Ozempic soon. Patient appreciative of call.  She states that she did take Prednisone prior to admit, which she thinks is why blood sugars were so high? Will follow.   Thanks,  Lorenza Cambridge, RN, BC-ADM Inpatient Diabetes Coordinator Pager [phone]  [phone]

## 2023-10-02 NOTE — Telephone Encounter (Addendum)
 Patient Product/process development scientist completed.    The patient is insured through [facility]. Patient has [employer], may use a copay card, and/or apply for patient assistance if available.    Ran test claim for Eliquis 5 mg and the current 30 day co-pay is $594.68 due to a $7000.00 deductible.  Ran test claim for Xarelto 20 mg and the current 30 day co-pay is $586.62 due to a $7000.00 deductible.  Ran test claim for dabigatran (Pradaxa) 150  mg and the current 30 day co-pay is $36.30  This test claim was processed through [employer]- copay amounts may vary at other pharmacies due to [employer], or as the patient moves through the different stages of their insurance plan.     Jessica Tran, CPHT Pharmacy Technician III Certified Patient Advocate [facility] Patient Advocate Team Direct Number: [phone]  Fax: [phone]

## 2023-10-03 DIAGNOSIS — I214 Non-ST elevation (NSTEMI) myocardial infarction: Secondary | ICD-10-CM | POA: Diagnosis not present

## 2023-10-03 LAB — BASIC METABOLIC PANEL WITH GFR
Anion gap: 10 (ref 5–15)
BUN: 12 mg/dL (ref 6–20)
CO2: 22 mmol/L (ref 22–32)
Calcium: 8.7 mg/dL — ABNORMAL LOW (ref 8.9–10.3)
Chloride: 104 mmol/L (ref 98–111)
Creatinine, Ser: 0.62 mg/dL (ref 0.44–1.00)
GFR, Estimated: >60 mL/min (ref >60)
Glucose, Bld: 150 mg/dL — ABNORMAL HIGH (ref 70–99)
Potassium: 3.8 mmol/L (ref 3.5–5.1)
Sodium: 136 mmol/L (ref 135–145)

## 2023-10-03 LAB — CBC
HCT: 38.2 % (ref 36.0–46.0)
Hemoglobin: 13 g/dL (ref 12.0–15.0)
MCH: 26.9 pg (ref 26.0–34.0)
MCHC: 34 g/dL (ref 30.0–36.0)
MCV: 79.1 fL — ABNORMAL LOW (ref 80.0–100.0)
Platelets: 217 10*3/uL (ref 150–400)
RBC: 4.83 MIL/uL (ref 3.87–5.11)
RDW: 13.8 % (ref 11.5–15.5)
WBC: 7 10*3/uL (ref 4.0–10.5)
nRBC: 0 % (ref 0.0–0.2)

## 2023-10-03 LAB — GLUCOSE, CAPILLARY
Glucose-Capillary: 160 mg/dL — ABNORMAL HIGH (ref 70–99)
Glucose-Capillary: 227 mg/dL — ABNORMAL HIGH (ref 70–99)

## 2023-10-03 NOTE — Progress Notes (Addendum)
 SATURATION QUALIFICATIONS: (This note is used to comply with regulatory documentation for home oxygen)  Patient Saturations on Room Air at Rest = 100%  Patient Saturations on Room Air while Ambulating = 83%  Patient Saturations on 2 Liters of oxygen while Ambulating = 95%  Please briefly explain why patient needs home oxygen: Patient's oxygen saturation dropped to 83% while also experiencing shortness of breath during ambulation. Patient needed to be 88% or above.

## 2023-10-03 NOTE — H&P (View-Only) (Signed)
 Progress Note    10/03/2023 10:49 AM * No surgery found *  Subjective:  Jessica Tran is a 58 year old female with history of truncal obesity, hyperlipidemia, insulin-dependent diabetes mellitus, anxiety, presented to [facility] emergency department with shortness of breath.  Upon workup was found to have a pulmonary embolism.  Vascular ultrasound of bilateral lower extremities was done and patient was found to have a right nonocclusive popliteal thrombus.  She has been placed on a heparin infusion.    On exam this morning she is resting comfortably sitting in the bedside chair.  Patient is now on room air and heparin infusion has been discontinued. Patient was started on oral anticoagulation.  She does endorse having some exertional shortness of breath as she ambulates back and forth to the bathroom.  Patient underwent a second CT Scan with PE protocol awhich now shows improvement. Patient's RV/LV ratio on initial scan was 1.4 and down on second scan to 1.1 No complaints overnight vitals all remained stable.   Vitals:   10/03/23 0352 10/03/23 0741  BP: 136/60 137/76  Pulse: 86 78  Resp: 20   Temp: 97.6 F (36.4 C) 98 F (36.7 C)  SpO2: 95% 96%   Physical Exam: Cardiac:  RRR, normal S1/S2.  No pedal edema.  No rubs clicks or gallops. Lungs: Clear on auscultation throughout.  No rales rhonchi or wheezing.  Normal respiratory effort.  On 1 L nasal cannula  oxygen for support. Incisions: None Extremities: Moves all extremities well.  Warm to touch.  Palpable pulses. Abdomen: Positive bowel sounds throughout, soft, nontender and nondistended. Neurologic: Alert and oriented x 3, answers all questions and follows commands appropriately  CBC    Component Value Date/Time   WBC 7.0 10/03/2023 0517   RBC 4.83 10/03/2023 0517   HGB 13.0 10/03/2023 0517   HGB 13.7 03/26/2015 0804   HCT 38.2 10/03/2023 0517   HCT 39.8 03/26/2015 0804   PLT 217 10/03/2023 0517   PLT 304 03/26/2015 0804    MCV 79.1 (L) 10/03/2023 0517   MCV 81 03/26/2015 0804   MCV 80 07/28/2014 0952   MCH 26.9 10/03/2023 0517   MCHC 34.0 10/03/2023 0517   RDW 13.8 10/03/2023 0517   RDW 12.8 03/26/2015 0804   RDW 12.9 07/28/2014 0952   LYMPHSABS 1.8 09/30/2023 1121   LYMPHSABS 2.5 03/26/2015 0804   LYMPHSABS 1.6 07/28/2014 0952   MONOABS 0.2 09/30/2023 1121   MONOABS 0.4 07/28/2014 0952   EOSABS 0.0 09/30/2023 1121   EOSABS 0.2 03/26/2015 0804   EOSABS 0.1 07/28/2014 0952   BASOSABS 0.0 09/30/2023 1121   BASOSABS 0.0 03/26/2015 0804   BASOSABS 0.0 07/28/2014 0952    BMET    Component Value Date/Time   NA 136 10/03/2023 0517   NA 138 03/26/2015 0804   NA 136 07/28/2014 0952   K 3.8 10/03/2023 0517   K 3.0 (L) 07/28/2014 0952   CL 104 10/03/2023 0517   CL 102 07/28/2014 0952   CO2 22 10/03/2023 0517   CO2 24 07/28/2014 0952   GLUCOSE 150 (H) 10/03/2023 0517   GLUCOSE 205 (H) 07/28/2014 0952   BUN 12 10/03/2023 0517   BUN 10 03/26/2015 0804   BUN 11 07/28/2014 0952   CREATININE 0.62 10/03/2023 0517   CREATININE 0.65 12/14/2015 1246   CALCIUM 8.7 (L) 10/03/2023 0517   CALCIUM 9.3 07/28/2014 0952   GFRNONAA >60 10/03/2023 0517   GFRNONAA >89 12/14/2015 1246   GFRAA >89 12/14/2015 1246  INR    Component Value Date/Time   INR 1.1 09/30/2023 1528   INR 0.8 06/25/2012 1059     Intake/Output Summary (Last 24 hours) at 10/03/2023 1049 Last data filed at 10/03/2023 0900 Gross per 24 hour  Intake 459.36 ml  Output --  Net 459.36 ml     Assessment/Plan:  58 y.o. female who presented to [facility] emergency department with chief concerns of chest tightness, shortness of breath, dry cough for the past week. Upon workup she was noted to have bilateral pulmonary embolisms as well as right lower extremity nonocclusive thrombus. Patient was placed on Heparin Infusion. Second Ct Scan with PE Protocol shows improvement.  * No surgery found *   PLAN Vascular surgery had a long detailed discussion  with the patient and Dr. Esaw Grandchild at the bedside this morning.  Patient is recovering as expected.  Patient rests comfortably on room air with adequate oxygen saturations.  We discussed in detail the risks versus the benefits of pulmonary thrombectomy at this time.  Patient verbalizes her understanding and wishes to proceed with anticoagulation only.  It was recommended the patient follow-up with pulmonology for her pulmonary embolisms and follow-up with vein and vascular surgery for her right lower extremity nonocclusive DVT.  Patient was started on oral anticoagulation this morning while heparin infusion has been discontinued.  Patient was started on Eliquis 10 mg twice daily for 7 days to then be converted to 5 mg twice daily indefinitely.  Dr. Denton Lank agrees to discharge the patient home later today.  DVT prophylaxis: Eliquis 10 mg   Marcie Bal [facility] 10/03/2023 10:49 AM

## 2023-10-03 NOTE — TOC Transition Note (Signed)
 Transition of Care [facility]) - Discharge Note   Patient Details  Name: Jessica Tran MRN: [id number] Date of Birth: 1965/12/16  [facility] CM/SW Contact:  Truddie Hidden, RN Phone Number: 10/03/2023, 3:56 PM   Clinical Narrative:    Spoke with patient regarding discharge home. She stated her sister will transport her home. She was advised a portable tank for a new home oxygen arrangement will be brought to her room for transport, and [employer]  will bring the remaining set up this afternoon.   Request for home oxygen sent to [facility] from [employer].           Patient Goals and CMS Choice            Discharge Placement                       Discharge Plan and Services Additional resources added to the After Visit Summary for                                       Social Drivers of Health (SDOH) Interventions SDOH Screenings   Food Insecurity: No Food Insecurity (09/30/2023)  Housing: Low Risk  (09/30/2023)  Transportation Needs: No Transportation Needs (09/30/2023)  Utilities: Not At Risk (09/30/2023)  Financial Resource Strain: Low Risk  (03/14/2023)   Received from [facility]  Social Connections: Unknown (09/30/2023)  Tobacco Use: Low Risk  (09/30/2023)  Recent Concern: Tobacco Use - Medium Risk (09/30/2023)   Received from [facility]     Readmission Risk Interventions     No data to display

## 2023-10-03 NOTE — Plan of Care (Signed)
 Plan of care was reviewed. Pt has been progressing. She is stable hemodynamically, afebrile, normal respiratory effort, on room air or 2 LPM as needed at night. No SOB. She has a complaint of headache. Tylenol PRN was given, Pt has been able to rest well. No obvious acute distress overnight. We will continue to monitor.   Problem: Education: Goal: Ability to describe self-care measures that may prevent or decrease complications (Diabetes Survival Skills Education) will improve Outcome: Progressing Goal: Individualized Educational Video(s) Outcome: Progressing   Problem: Coping: Goal: Ability to adjust to condition or change in health will improve Outcome: Progressing   Problem: Health Behavior/Discharge Planning: Goal: Ability to identify and utilize available resources and services will improve Outcome: Progressing Goal: Ability to manage health-related needs will improve Outcome: Progressing   Problem: Metabolic: Goal: Ability to maintain appropriate glucose levels will improve Outcome: Progressing   Problem: Nutritional: Goal: Maintenance of adequate nutrition will improve Outcome: Progressing Goal: Progress toward achieving an optimal weight will improve Outcome: Progressing   Problem: Skin Integrity: Goal: Risk for impaired skin integrity will decrease Outcome: Progressing   Problem: Tissue Perfusion: Goal: Adequacy of tissue perfusion will improve Outcome: Progressing   Problem: Education: Goal: Knowledge of General Education information will improve Description: Including pain rating scale, medication(s)/side effects and non-pharmacologic comfort measures Outcome: Progressing   Problem: Health Behavior/Discharge Planning: Goal: Ability to manage health-related needs will improve Outcome: Progressing   Problem: Clinical Measurements: Goal: Ability to maintain clinical measurements within normal limits will improve Outcome: Progressing Goal: Will remain free from  infection Outcome: Progressing Goal: Diagnostic test results will improve Outcome: Progressing Goal: Respiratory complications will improve Outcome: Progressing Goal: Cardiovascular complication will be avoided Outcome: Progressing   Problem: Activity: Goal: Risk for activity intolerance will decrease Outcome: Progressing   Problem: Pain Managment: Goal: General experience of comfort will improve and/or be controlled Outcome: Progressing   Filiberto Pinks, RN

## 2023-10-03 NOTE — Plan of Care (Signed)

## 2023-10-03 NOTE — Progress Notes (Signed)
 Progress Note    10/03/2023 10:49 AM * No surgery found *  Subjective:  Jessica Tran is a 58 year old female with history of truncal obesity, hyperlipidemia, insulin-dependent diabetes mellitus, anxiety, presented to [facility] emergency department with shortness of breath.  Upon workup was found to have a pulmonary embolism.  Vascular ultrasound of bilateral lower extremities was done and patient was found to have a right nonocclusive popliteal thrombus.  She has been placed on a heparin infusion.    On exam this morning she is resting comfortably sitting in the bedside chair.  Patient is now on room air and heparin infusion has been discontinued. Patient was started on oral anticoagulation.  She does endorse having some exertional shortness of breath as she ambulates back and forth to the bathroom.  Patient underwent a second CT Scan with PE protocol awhich now shows improvement. Patient's RV/LV ratio on initial scan was 1.4 and down on second scan to 1.1 No complaints overnight vitals all remained stable.   Vitals:   10/03/23 0352 10/03/23 0741  BP: 136/60 137/76  Pulse: 86 78  Resp: 20   Temp: 97.6 F (36.4 C) 98 F (36.7 C)  SpO2: 95% 96%   Physical Exam: Cardiac:  RRR, normal S1/S2.  No pedal edema.  No rubs clicks or gallops. Lungs: Clear on auscultation throughout.  No rales rhonchi or wheezing.  Normal respiratory effort.  On 1 L nasal cannula  oxygen for support. Incisions: None Extremities: Moves all extremities well.  Warm to touch.  Palpable pulses. Abdomen: Positive bowel sounds throughout, soft, nontender and nondistended. Neurologic: Alert and oriented x 3, answers all questions and follows commands appropriately  CBC    Component Value Date/Time   WBC 7.0 10/03/2023 0517   RBC 4.83 10/03/2023 0517   HGB 13.0 10/03/2023 0517   HGB 13.7 03/26/2015 0804   HCT 38.2 10/03/2023 0517   HCT 39.8 03/26/2015 0804   PLT 217 10/03/2023 0517   PLT 304 03/26/2015 0804    MCV 79.1 (L) 10/03/2023 0517   MCV 81 03/26/2015 0804   MCV 80 07/28/2014 0952   MCH 26.9 10/03/2023 0517   MCHC 34.0 10/03/2023 0517   RDW 13.8 10/03/2023 0517   RDW 12.8 03/26/2015 0804   RDW 12.9 07/28/2014 0952   LYMPHSABS 1.8 09/30/2023 1121   LYMPHSABS 2.5 03/26/2015 0804   LYMPHSABS 1.6 07/28/2014 0952   MONOABS 0.2 09/30/2023 1121   MONOABS 0.4 07/28/2014 0952   EOSABS 0.0 09/30/2023 1121   EOSABS 0.2 03/26/2015 0804   EOSABS 0.1 07/28/2014 0952   BASOSABS 0.0 09/30/2023 1121   BASOSABS 0.0 03/26/2015 0804   BASOSABS 0.0 07/28/2014 0952    BMET    Component Value Date/Time   NA 136 10/03/2023 0517   NA 138 03/26/2015 0804   NA 136 07/28/2014 0952   K 3.8 10/03/2023 0517   K 3.0 (L) 07/28/2014 0952   CL 104 10/03/2023 0517   CL 102 07/28/2014 0952   CO2 22 10/03/2023 0517   CO2 24 07/28/2014 0952   GLUCOSE 150 (H) 10/03/2023 0517   GLUCOSE 205 (H) 07/28/2014 0952   BUN 12 10/03/2023 0517   BUN 10 03/26/2015 0804   BUN 11 07/28/2014 0952   CREATININE 0.62 10/03/2023 0517   CREATININE 0.65 12/14/2015 1246   CALCIUM 8.7 (L) 10/03/2023 0517   CALCIUM 9.3 07/28/2014 0952   GFRNONAA >60 10/03/2023 0517   GFRNONAA >89 12/14/2015 1246   GFRAA >89 12/14/2015 1246  INR    Component Value Date/Time   INR 1.1 09/30/2023 1528   INR 0.8 06/25/2012 1059     Intake/Output Summary (Last 24 hours) at 10/03/2023 1049 Last data filed at 10/03/2023 0900 Gross per 24 hour  Intake 459.36 ml  Output --  Net 459.36 ml     Assessment/Plan:  58 y.o. female who presented to [facility] emergency department with chief concerns of chest tightness, shortness of breath, dry cough for the past week. Upon workup she was noted to have bilateral pulmonary embolisms as well as right lower extremity nonocclusive thrombus. Patient was placed on Heparin Infusion. Second Ct Scan with PE Protocol shows improvement.  * No surgery found *   PLAN Vascular surgery had a long detailed discussion  with the patient and Dr. Esaw Grandchild at the bedside this morning.  Patient is recovering as expected.  Patient rests comfortably on room air with adequate oxygen saturations.  We discussed in detail the risks versus the benefits of pulmonary thrombectomy at this time.  Patient verbalizes her understanding and wishes to proceed with anticoagulation only.  It was recommended the patient follow-up with pulmonology for her pulmonary embolisms and follow-up with vein and vascular surgery for her right lower extremity nonocclusive DVT.  Patient was started on oral anticoagulation this morning while heparin infusion has been discontinued.  Patient was started on Eliquis 10 mg twice daily for 7 days to then be converted to 5 mg twice daily indefinitely.  Dr. Denton Lank agrees to discharge the patient home later today.  DVT prophylaxis: Eliquis 10 mg   Marcie Bal [facility] 10/03/2023 10:49 AM

## 2023-10-03 NOTE — Discharge Summary (Signed)
 Physician Discharge Summary   Patient: Jessica Tran MRN: [id number] DOB: [birth year]  Admit date:     09/30/2023  Discharge date: 10/03/2023  Discharge Physician: Jessica Tran   PCP: Jessica Ferrier, MD   Recommendations at discharge:   Follow up with Primary Care in 1-2 weeks Follow up with Pulmonology, referral sent to [facility] with Dr. Karna Tran at patient's request Follow up with Vascular surgery for surveillance of right lower extremity DVT Repeat CBC, CMP at follow up  Discharge Diagnoses: Principal Problem:   NSTEMI (non-ST elevated myocardial infarction) [facility]) Active Problems:   Generalized anxiety disorder   Morbid obesity (HCC)   Hepatic steatosis   BMI 40.0-44.9, adult (HCC)   Anxiety state   Hyperlipidemia   Bilateral pulmonary embolism (HCC)  Resolved Problems:   * No resolved hospital problems. *  Hospital Course:  "Ms. Jessica Tran is a 58 year old female with history of truncal obesity, hyperlipidemia, insulin-dependent diabetes mellitus, anxiety, who presents emergency department for chief concerns of chest tightness, shortness of breath, dry cough for the past week. ..." See H&P for full HPI on admission & ED course.   Patient was found to have acute bilateral pulmonary emboli with CTA showing signs of right-heart strain.  Patient admitted and started on IV heparin.   Vascular Surgery was consulted.    Further hospital course and management as outlined below  4/8 -- pt doing well, tolerating room air at rest, but with O2 sats dipping into mid 80's with ambulation.  Qualifies for home O2 during activity at this time, ordered and TOC notified.  Pt notes overall improvement in dyspnea, no longer dyspneic at rest and exertional dyspnea has improved.    Pt is medically stable and agreeable for discharge home today with outpatient follow up.    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   EKG non-acute. No ischemic chest pain. Echo showed EF 55-60%, grade I DD, normal RV size and function (no strain) --Treated IV heparin empirically   Bilateral pulmonary embolism, acute RLE DVT, popliteal With CTA evidence of right heart strain, RV to LV ratio is 1.4 4/6 Echo showed EF 55-60%, grade I DD, normal RV size and function (no strain) --Management with heparin drip >> Eliquis 10 mg PO BID x 7 days, then 5 mg PO BID --Duration of anticoagulation to be addressed in follow up --Vascular Surgery consulted, recommended treating with  anticoagulation, no thrombectomy given improvement on heparin. --Outpatient Vascular follow up for RLE DVT --Referral sent for Pulmonology follow up for PE's     Lactic acidosis -- unlikely infection based on lack of infectious symptoms. Pt recently had flu but symptoms have resolved.  Resolved.   Hypokalemia - Resolved. K 3.3 on 4/6 was replaced --BMP in AM   Anxiety state --PRN low dose Xanax at home   Obesity, class III Body mass index is 41.32 kg/m. Complicates overall care and prognosis.  Recommend lifestyle modifications including physical activity and diet for weight loss and overall long-term health.   Hepatic steatosis Per Dr. Sedalia Tran: "Extensive discussion with patient at bedside for working with her health insurance to ensure that she gets the appropriate diabetes mellitus medication and follow-up with primary care provider for guidance regarding healthy weight loss Diet counseling: Encourage patient to eat lean protein with steamed vegetables and avoid high carb food items especially highly processed foods.  Patient endorses understanding and states she will try  her best."       Consultants: Vascular surgery  Procedures performed: none  Disposition: Home  Diet recommendation:  Cardiac and Carb modified diet  DISCHARGE MEDICATION: Allergies as of 10/03/2023       Reactions   Clindamycin/lincomycin Swelling   Throat swelling    Amoxicillin-pot Clavulanate Diarrhea   Cefuroxime Axetil Diarrhea   Pravastatin Diarrhea        Medication List     STOP taking these medications    insulin NPH-regular Human (70-30) 100 UNIT/ML injection   meclizine 25 MG tablet Commonly known as: ANTIVERT   metFORMIN 500 MG 24 hr tablet Commonly known as: GLUCOPHAGE-XR   ondansetron 4 MG disintegrating tablet Commonly known as: ZOFRAN-ODT   pioglitazone 15 MG tablet Commonly known as: ACTOS   Semaglutide(0.25 or 0.5MG /DOS) 2 MG/3ML Sopn   tamsulosin 0.4 MG Caps capsule Commonly known as: Flomax   valACYclovir 500 MG tablet Commonly known as: VALTREX       TAKE these medications    albuterol 108 (90 Base) MCG/ACT inhaler Commonly known as: VENTOLIN HFA Inhale into the lungs.   ALPRAZolam 0.25 MG tablet Commonly known as: XANAX alprazolam 0.25 mg tablet   atorvastatin 10 MG tablet Commonly known as: LIPITOR Take 1 tablet by mouth daily.   BERBERINE COMPLEX PO Take 1,200 mg by mouth daily.   Eliquis DVT/PE Starter Pack Generic drug: Apixaban Starter Pack (10mg  and 5mg ) Take as directed on package: start with two-5mg  tablets twice daily for 7 days. On day 8, switch to one-5mg  tablet twice daily.   Farxiga 10 MG Tabs tablet Generic drug: dapagliflozin propanediol Take 1 tablet by mouth daily.   GE100 Blood Glucose Test test strip Generic drug: glucose blood USE TO TEST THREE TIMES DAILY   glipiZIDE 10 MG 24 hr tablet Commonly known as: glipiZIDE XL Take 2 tablets (20 mg total) by mouth daily with breakfast.   losartan 25 MG tablet Commonly known as: COZAAR Take 1 tablet by mouth daily.   omeprazole 40 MG capsule Commonly known as: PRILOSEC TAKE 1 CAPSULE BY MOUTH 2 TIMES DAILY BEFORE A MEAL What changed: See the new instructions.   ondansetron 4 MG tablet Commonly known as: Zofran Take 1 tablet (4 mg total) by mouth every 8 (eight) hours as needed.               Durable Medical  Equipment  (From admission, onward)           Start     Ordered   10/03/23 1426  For home use only DME oxygen  Once       Comments: 2 L/min needed with ambulation/exertion  Question Answer Comment  Length of Need 6 Months   Mode or (Route) Nasal cannula   Liters per Minute 2   Frequency Continuous (stationary and portable oxygen unit needed)   Oxygen delivery system Gas      10/03/23 1426            Follow-up Information     Georgiana Spinner, NP Follow up in 3 month(s).   Specialty: Vascular Surgery Why: Right Lower Extremity Venous Doppler for non occlusive DVT. Hx of Bilat PE Contact information: [address] [place] [address] [phone]         Vida Rigger, MD Follow up.   Specialty: Pulmonary Disease Why: Referral sent for outpatient follow up for bilteral PE's Contact information: [address] [place] [address] [phone]  Jessica Ferrier, MD. Schedule an appointment as soon as possible for a visit.   Specialty: Internal Medicine Why: Hospital follow up in 1-2 weeks Contact information: [address] [facility] [place] [address] [phone]                Discharge Exam: Ceasar Mons Weights   09/30/23 1416  Weight: 119.7 kg   General exam: awake, alert, no acute distress Respiratory system: CTAB, no wheezes, rales or rhonchi, normal respiratory effort. Cardiovascular system: normal S1/S2, RRR, no JVD, murmurs, rubs, gallops, no pedal edema.   Gastrointestinal system: soft, NT, ND, no HSM felt, +bowel sounds. Central nervous system: A&O x 3. no gross focal neurologic deficits, normal speech Skin: dry, intact, normal temperature Psychiatry: normal mood, congruent affect, judgement and insight appear normal   Condition at discharge: stable  The results of significant diagnostics from this hospitalization (including imaging, microbiology, ancillary and laboratory) are  listed below for reference.   Imaging Studies: CT Angio Chest Pulmonary Embolism (PE) W or WO Contrast Result Date: 10/02/2023 CLINICAL DATA:  Follow-up pulmonary embolus. Exertional shortness of breath. EXAM: CT ANGIOGRAPHY CHEST WITH CONTRAST TECHNIQUE: Multidetector CT imaging of the chest was performed using the standard protocol during bolus administration of intravenous contrast. Multiplanar CT image reconstructions and MIPs were obtained to evaluate the vascular anatomy. RADIATION DOSE REDUCTION: This exam was performed according to the departmental dose-optimization program which includes automated exposure control, adjustment of the mA and/or kV according to patient size and/or use of iterative reconstruction technique. CONTRAST:  75mL OMNIPAQUE IOHEXOL 350 MG/ML SOLN COMPARISON:  Chest CT 2 days ago FINDINGS: Cardiovascular: Known pulmonary emboli, slight decrease in thromboembolic burden over the last 2 days. Moderate-large clot burden persists with filling defects involving the distal main, all lobar and many segmental branches. Persistent but decreased right heart strain, RV to LV ratio is 1.13, previously 1.4. There is similar dilatation of the main pulmonary artery. Contrast refluxes into the hepatic veins and IVC. No pericardial effusion. Mediastinum/Nodes: No enlarged mediastinal or hilar lymph nodes. Unremarkable thyroid gland. Decompressed esophagus. Lungs/Pleura: Small subpleural opacity in the dependent right lower lobe, series 6, image 91, likely a small pulmonary infarct. Additional dependent atelectasis within the lower lobes. No pleural effusion. Breathing motion artifact limits detailed assessment Upper Abdomen: Hepatic steatosis. No acute upper abdominal findings. Musculoskeletal: There are no acute or suspicious osseous abnormalities. Review of the MIP images confirms the above findings. IMPRESSION: 1. Known pulmonary emboli, slight decrease in thromboembolic burden over the last 2  days. Moderate-large clot burden persists with filling defects involving the distal main, all lobar and numerous segmental branches. 2. Persistent but decreased right heart strain, RV to LV ratio is 1.13, previously 1.4. 3. Small subpleural opacity in the dependent right lower lobe, likely a small pulmonary infarct. 4. Hepatic steatosis. Electronically Signed   By: Narda Rutherford M.D.   On: 10/02/2023 15:20   ECHOCARDIOGRAM COMPLETE Result Date: 10/01/2023    ECHOCARDIOGRAM REPORT   Patient Name:   KAIRA STRINGFIELD [facility] Date of Exam: 10/01/2023 Medical Rec #:  [id number]         Height:       67.0 in Accession #:    [id number]        Weight:       263.9 lb Date of Birth:  [birth year]          BSA:          2.275 m Patient Age:  57 years          BP:           141/73 mmHg Patient Gender: F                 HR:           84 bpm. Exam Location:  [facility] Procedure: 2D Echo, Cardiac Doppler and Color Doppler (Both Spectral and Color            Flow Doppler were utilized during procedure). Indications:     Pulmonary Embolus I26.09  History:         Patient has no prior history of Echocardiogram examinations.  Sonographer:     Elwin Sleight RDCS Referring Phys:  [id number] AMY N COX Diagnosing Phys: Marcina Millard MD  Sonographer Comments: Suboptimal subcostal window. Image acquisition challenging due to patient body habitus and Image acquisition challenging due to respiratory motion. IMPRESSIONS  1. Left ventricular ejection fraction, by estimation, is 55 to 60%. The left ventricle has normal function. The left ventricle has no regional wall motion abnormalities. Left ventricular diastolic parameters are consistent with Grade I diastolic dysfunction (impaired relaxation).  2. Right ventricular systolic function is normal. The right ventricular size is normal.  3. The mitral valve is normal in structure. Trivial mitral valve regurgitation. No evidence of mitral stenosis.  4. The aortic valve is normal in structure. Aortic valve  regurgitation is not visualized. No aortic stenosis is present.  5. The inferior vena cava is normal in size with greater than 50% respiratory variability, suggesting right atrial pressure of 3 mmHg. FINDINGS  Left Ventricle: Left ventricular ejection fraction, by estimation, is 55 to 60%. The left ventricle has normal function. The left ventricle has no regional wall motion abnormalities. Strain was performed and the global longitudinal strain is indeterminate. The left ventricular internal cavity size was normal in size. There is no left ventricular hypertrophy. Left ventricular diastolic parameters are consistent with Grade I diastolic dysfunction (impaired relaxation). Right Ventricle: The right ventricular size is normal. No increase in right ventricular wall thickness. Right ventricular systolic function is normal. Left Atrium: Left atrial size was normal in size. Right Atrium: Right atrial size was normal in size. Pericardium: There is no evidence of pericardial effusion. Mitral Valve: The mitral valve is normal in structure. Trivial mitral valve regurgitation. No evidence of mitral valve stenosis. Tricuspid Valve: The tricuspid valve is normal in structure. Tricuspid valve regurgitation is trivial. No evidence of tricuspid stenosis. Aortic Valve: The aortic valve is normal in structure. Aortic valve regurgitation is not visualized. No aortic stenosis is present. Aortic valve peak gradient measures 9.9 mmHg. Pulmonic Valve: The pulmonic valve was normal in structure. Pulmonic valve regurgitation is not visualized. No evidence of pulmonic stenosis. Aorta: The aortic root is normal in size and structure. Venous: The inferior vena cava is normal in size with greater than 50% respiratory variability, suggesting right atrial pressure of 3 mmHg. IAS/Shunts: No atrial level shunt detected by color flow Doppler. Additional Comments: 3D was performed not requiring image post processing on an independent workstation  and was indeterminate.  LEFT VENTRICLE PLAX 2D LVIDd:         3.60 cm   Diastology LVIDs:         2.50 cm   LV e' medial:    9.36 cm/s LV PW:         1.10 cm   LV E/e' medial:  8.1 LV IVS:  1.00 cm   LV e' lateral:   14.50 cm/s LVOT diam:     1.90 cm   LV E/e' lateral: 5.2 LV SV:         52 LV SV Index:   23 LVOT Area:     2.84 cm  RIGHT VENTRICLE RV Basal diam:  3.30 cm RV S prime:     9.90 cm/s TAPSE (M-mode): 1.8 cm LEFT ATRIUM             Index        RIGHT ATRIUM           Index LA diam:        4.00 cm 1.76 cm/m   RA Area:     20.30 cm LA Vol (A2C):   46.4 ml 20.39 ml/m  RA Volume:   60.10 ml  26.42 ml/m LA Vol (A4C):   42.5 ml 18.68 ml/m LA Biplane Vol: 44.7 ml 19.65 ml/m  AORTIC VALVE                 PULMONIC VALVE AV Area (Vmax): 1.90 cm     PV Vmax:        1.34 m/s AV Vmax:        157.00 cm/s  PV Peak grad:   7.2 mmHg AV Peak Grad:   9.9 mmHg     RVOT Peak grad: 2 mmHg LVOT Vmax:      105.00 cm/s LVOT Vmean:     67.300 cm/s LVOT VTI:       0.184 m  AORTA Ao Root diam: 2.50 cm Ao Asc diam:  2.80 cm MITRAL VALVE               TRICUSPID VALVE MV Area (PHT): 3.68 cm    TR Peak grad:   29.4 mmHg MV Decel Time: 206 msec    TR Vmax:        271.00 cm/s MV E velocity: 75.50 cm/s MV A velocity: 88.00 cm/s  SHUNTS MV E/A ratio:  0.86        Systemic VTI:  0.18 m                            Systemic Diam: 1.90 cm Marcina Millard MD Electronically signed by Marcina Millard MD Signature Date/Time: 10/01/2023/12:17:31 PM    Final    US Venous Img Lower Bilateral (DVT) Result Date: 09/30/2023 CLINICAL DATA:  Pulmonary embolism. EXAM: BILATERAL LOWER EXTREMITY VENOUS DOPPLER ULTRASOUND TECHNIQUE: Gray-scale sonography with graded compression, as well as color Doppler and duplex ultrasound were performed to evaluate the lower extremity deep venous systems from the level of the common femoral vein and including the common femoral, femoral, profunda femoral, popliteal and calf veins including the  posterior tibial, peroneal and gastrocnemius veins when visible. The superficial great saphenous vein was also interrogated. Spectral Doppler was utilized to evaluate flow at rest and with distal augmentation maneuvers in the common femoral, femoral and popliteal veins. COMPARISON:  None Available. FINDINGS: RIGHT LOWER EXTREMITY Common Femoral Vein: No evidence of thrombus. Normal compressibility, respiratory phasicity and response to augmentation. Saphenofemoral Junction: No evidence of thrombus. Normal compressibility and flow on color Doppler imaging. Profunda Femoral Vein: No evidence of thrombus. Normal compressibility and flow on color Doppler imaging. Femoral Vein: No evidence of thrombus. Normal compressibility, respiratory phasicity and response to augmentation. Popliteal Vein: Nonocclusive thrombus. Calf Veins: No evidence of thrombus. Normal compressibility and flow on  color Doppler imaging. Superficial Great Saphenous Vein: No evidence of thrombus. Normal compressibility. Other Findings: Small amount of fluid in the popliteal fossa typical of Baker cyst. LEFT LOWER EXTREMITY Common Femoral Vein: No evidence of thrombus. Normal compressibility, respiratory phasicity and response to augmentation. Saphenofemoral Junction: No evidence of thrombus. Normal compressibility and flow on color Doppler imaging. Profunda Femoral Vein: No evidence of thrombus. Normal compressibility and flow on color Doppler imaging. Femoral Vein: No evidence of thrombus. Normal compressibility, respiratory phasicity and response to augmentation. Popliteal Vein: No evidence of thrombus. Normal compressibility, respiratory phasicity and response to augmentation. Calf Veins: No evidence of thrombus. Normal compressibility and flow on color Doppler imaging. Superficial Great Saphenous Vein: No evidence of thrombus. Normal compressibility. IMPRESSION: 1. Nonocclusive thrombus within the right popliteal vein. 2. No evidence of left lower  extremity DVT. Electronically Signed   By: Narda Rutherford M.D.   On: 09/30/2023 18:49   CT Angio Chest PE W and/or Wo Contrast Result Date: 09/30/2023 CLINICAL DATA:  Shortness of breath. Concern for pulmonary embolism. EXAM: CT ANGIOGRAPHY CHEST WITH CONTRAST TECHNIQUE: Multidetector CT imaging of the chest was performed using the standard protocol during bolus administration of intravenous contrast. Multiplanar CT image reconstructions and MIPs were obtained to evaluate the vascular anatomy. RADIATION DOSE REDUCTION: This exam was performed according to the departmental dose-optimization program which includes automated exposure control, adjustment of the mA and/or kV according to patient size and/or use of iterative reconstruction technique. CONTRAST:  75mL OMNIPAQUE IOHEXOL 350 MG/ML SOLN COMPARISON:  Chest radiograph dated 09/30/2023 and CT dated 08/12/2021. FINDINGS: Cardiovascular: There is no cardiomegaly or pericardial effusion. There is dilatation of the right heart chambers with RV/LV ratio of approximately 1.4 indicative of right heart straining. The thoracic aorta is unremarkable. Bilateral pulmonary artery emboli involving the lobar and segmental branches of the lower lobes and upper lobes. Mediastinum/Nodes: No hilar or mediastinal adenopathy. The esophagus is grossly unremarkable. No mediastinal fluid collection. Lungs/Pleura: No focal consolidation, pleural effusion, pneumothorax. The central airways are patent. Upper Abdomen: Fatty liver.  Cholecystectomy. Musculoskeletal: No acute osseous pathology. Review of the MIP images confirms the above findings. IMPRESSION: Positive for acute PE with CT evidence of right heart strain (RV/LV Ratio = 1.4) consistent with at least submassive (intermediate risk) PE. The presence of right heart strain has been associated with an increased risk of morbidity and mortality. Please refer to the "Code PE Focused" order set in EPIC. These results were called by  telephone at the time of interpretation on 09/30/2023 at 3:12 pm to Dr. Sedalia Tran, who verbally acknowledged these results. Electronically Signed   By: Elgie Collard M.D.   On: 09/30/2023 15:34   DG Chest 2 View Result Date: 09/30/2023 CLINICAL DATA:  Shortness of breath EXAM: CHEST - 2 VIEW COMPARISON:  06/26/2021 x-ray.  CT 08/12/2021 FINDINGS: The heart size and mediastinal contours are within normal limits. No consolidation, pneumothorax or effusion. No edema. The visualized skeletal structures are unremarkable. IMPRESSION: No acute cardiopulmonary disease. Electronically Signed   By: Karen Kays M.D.   On: 09/30/2023 12:02    Microbiology: Results for orders placed or performed during the hospital encounter of 09/30/23  MRSA Next Gen by PCR, Nasal     Status: None   Collection Time: 09/30/23  3:28 PM   Specimen: Nasal Mucosa; Nasal Swab  Result Value Ref Range Status   MRSA by PCR Next Gen NOT DETECTED NOT DETECTED Final    Comment: (NOTE) The GeneXpert MRSA Assay (  FDA approved for NASAL specimens only), is one component of a comprehensive MRSA colonization surveillance program. It is not intended to diagnose MRSA infection nor to guide or monitor treatment for MRSA infections. Test performance is not FDA approved in patients less than 78 years old. Performed at [facility], [address] [place], [place] [address]   C Difficile Quick Screen w PCR reflex     Status: None   Collection Time: 10/01/23 11:05 AM   Specimen: STOOL  Result Value Ref Range Status   C Diff antigen NEGATIVE NEGATIVE Final   C Diff toxin NEGATIVE NEGATIVE Final   C Diff interpretation No C. difficile detected.  Final    Comment: Performed at [facility], [address] [place], [place] [address]  Gastrointestinal Panel by PCR , Stool     Status: None   Collection Time: 10/01/23 11:05 AM   Specimen: STOOL  Result Value Ref Range Status   Campylobacter species NOT DETECTED NOT DETECTED  Final   Plesimonas shigelloides NOT DETECTED NOT DETECTED Final   Salmonella species NOT DETECTED NOT DETECTED Final   Yersinia enterocolitica NOT DETECTED NOT DETECTED Final   Vibrio species NOT DETECTED NOT DETECTED Final   Vibrio cholerae NOT DETECTED NOT DETECTED Final   Enteroaggregative E coli (EAEC) NOT DETECTED NOT DETECTED Final   Enteropathogenic E coli (EPEC) NOT DETECTED NOT DETECTED Final   Enterotoxigenic E coli (ETEC) NOT DETECTED NOT DETECTED Final   Shiga like toxin producing E coli (STEC) NOT DETECTED NOT DETECTED Final   Shigella/Enteroinvasive E coli (EIEC) NOT DETECTED NOT DETECTED Final   Cryptosporidium NOT DETECTED NOT DETECTED Final   Cyclospora cayetanensis NOT DETECTED NOT DETECTED Final   Entamoeba histolytica NOT DETECTED NOT DETECTED Final   Giardia lamblia NOT DETECTED NOT DETECTED Final   Adenovirus F40/41 NOT DETECTED NOT DETECTED Final   Astrovirus NOT DETECTED NOT DETECTED Final   Norovirus GI/GII NOT DETECTED NOT DETECTED Final   Rotavirus A NOT DETECTED NOT DETECTED Final   Sapovirus (I, II, IV, and V) NOT DETECTED NOT DETECTED Final    Comment: Performed at [facility], [address] [place], [place] [address]    Labs: CBC: Recent Labs  Lab 09/30/23 1121 10/01/23 0545 10/02/23 0636 10/03/23 0517  WBC 5.8 6.1 8.8 7.0  NEUTROABS 3.8  --   --   --   HGB 14.5 12.9 14.1 13.0  HCT 45.1 39.4 43.4 38.2  MCV 81.9 80.7 81.4 79.1*  PLT 213 197 230 217   Basic Metabolic Panel: Recent Labs  Lab 09/30/23 1121 10/01/23 0545 10/03/23 0517  NA 136 138 136  K 4.1 3.3* 3.8  CL 103 106 104  CO2 21* 23 22  GLUCOSE 284* 182* 150*  BUN 13 13 12   CREATININE 0.53 0.57 0.62  CALCIUM 8.9 8.2* 8.7*   Liver Function Tests: Recent Labs  Lab 09/30/23 1121  AST 32  ALT 44  ALKPHOS 84  BILITOT 0.7  PROT 7.4  ALBUMIN 3.6   CBG: Recent Labs  Lab 10/02/23 1223 10/02/23 1613 10/02/23 2111 10/03/23 0742 10/03/23 1302  GLUCAP 138*  177* 204* 160* 227*    Discharge time spent: greater than 30 minutes.  Signed: Pennie Banter, DO [facility] 10/03/2023

## 2023-10-16 ENCOUNTER — Emergency Department
Admission: EM | Admit: 2023-10-16 | Discharge: 2023-10-16 | Disposition: A | Discharge status: Against Medical Advice | Attending: Emergency Medicine | Admitting: Emergency Medicine

## 2023-10-16 ENCOUNTER — Other Ambulatory Visit: Payer: Self-pay

## 2023-10-16 ENCOUNTER — Emergency Department

## 2023-10-16 DIAGNOSIS — Z5321 Procedure and treatment not carried out due to patient leaving prior to being seen by health care provider: Secondary | ICD-10-CM | POA: Insufficient documentation

## 2023-10-16 DIAGNOSIS — R0602 Shortness of breath: Secondary | ICD-10-CM | POA: Diagnosis not present

## 2023-10-16 DIAGNOSIS — R079 Chest pain, unspecified: Secondary | ICD-10-CM | POA: Insufficient documentation

## 2023-10-16 LAB — CBC
HCT: 43.5 % (ref 36.0–46.0)
Hemoglobin: 14.1 g/dL (ref 12.0–15.0)
MCH: 26.2 pg (ref 26.0–34.0)
MCHC: 32.4 g/dL (ref 30.0–36.0)
MCV: 80.9 fL (ref 80.0–100.0)
Platelets: 250 10*3/uL (ref 150–400)
RBC: 5.38 MIL/uL — ABNORMAL HIGH (ref 3.87–5.11)
RDW: 14 % (ref 11.5–15.5)
WBC: 9.2 10*3/uL (ref 4.0–10.5)
nRBC: 0 % (ref 0.0–0.2)

## 2023-10-16 LAB — BASIC METABOLIC PANEL WITH GFR
Anion gap: 10 (ref 5–15)
BUN: 18 mg/dL (ref 6–20)
CO2: 23 mmol/L (ref 22–32)
Calcium: 9.1 mg/dL (ref 8.9–10.3)
Chloride: 104 mmol/L (ref 98–111)
Creatinine, Ser: 0.68 mg/dL (ref 0.44–1.00)
GFR, Estimated: >60 mL/min (ref >60)
Glucose, Bld: 109 mg/dL — ABNORMAL HIGH (ref 70–99)
Potassium: 3.5 mmol/L (ref 3.5–5.1)
Sodium: 137 mmol/L (ref 135–145)

## 2023-10-16 LAB — TROPONIN I (HIGH SENSITIVITY)
Troponin I (High Sensitivity): 4 ng/L (ref <18)
Troponin I (High Sensitivity): 4 ng/L (ref <18)

## 2023-10-16 NOTE — ED Provider Triage Note (Signed)
 Emergency Medicine Provider Triage Evaluation Note  Jessica Tran, a 58 y.o. female  was evaluated in triage.  Pt complains of chest pain and shortness of breath.  Patient returns to the ED after a 4-day admission last week for an acute PE/DVT.  She completed 1/2-day work today, and began to experience some increased work of breathing but she denies any nausea, vomiting, cough, or hemoptysis.  Review of Systems  Positive: Chest pain, SOB Negative: Hemoptysis  Physical Exam  Ht 5\' 7"  (1.702 m)   Wt 116.6 kg   BMI 40.25 kg/m  Gen:   Awake, no distress NAD Resp:  Normal effort CTA MSK:   Moves extremities without difficulty  CVS:  RRR  1+ pitting edema BLE  Medical Decision Making  Medically screening exam initiated at 4:02 PM.  Appropriate orders placed.  Jessica Tran was informed that the remainder of the evaluation will be completed by another provider, this initial triage assessment does not replace that evaluation, and the importance of remaining in the ED until their evaluation is complete.  Patient to the ED for evaluation of chest pain and shortness of breath with recent history of DVT/bilateral PE.   May Sparks, PA-C 10/16/23 [phone]

## 2023-10-16 NOTE — ED Triage Notes (Signed)
 Pt comes with c/o cp. Pt states this started back near the 8th. Pt states this has been on and off. 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 Pt is on thinner .

## 2023-10-17 ENCOUNTER — Telehealth (INDEPENDENT_AMBULATORY_CARE_PROVIDER_SITE_OTHER): Payer: Self-pay

## 2023-10-17 NOTE — Telephone Encounter (Signed)
 Spoke with the patient and she is scheduled with Dr. Vonna Guardian for a thrombectomy on 10/18/23 with a 11:00 am arrival time to the [facility]. Pre-procedure instructions were discussed and will be sent to Mychart.

## 2023-10-18 ENCOUNTER — Other Ambulatory Visit: Payer: Self-pay

## 2023-10-18 ENCOUNTER — Other Ambulatory Visit (INDEPENDENT_AMBULATORY_CARE_PROVIDER_SITE_OTHER): Payer: Self-pay | Admitting: Nurse Practitioner

## 2023-10-18 ENCOUNTER — Encounter
Admission: RE | Disposition: A | Discharge status: Home / Self Care | Payer: Self-pay | Source: Home / Self Care | Attending: Vascular Surgery

## 2023-10-18 ENCOUNTER — Encounter: Payer: Self-pay | Admitting: Vascular Surgery

## 2023-10-18 ENCOUNTER — Observation Stay
Admission: RE | Admit: 2023-10-18 | Discharge: 2023-10-19 | Disposition: A | Discharge status: Home / Self Care | Attending: Vascular Surgery | Admitting: Vascular Surgery

## 2023-10-18 DIAGNOSIS — Z7984 Long term (current) use of oral hypoglycemic drugs: Secondary | ICD-10-CM | POA: Diagnosis not present

## 2023-10-18 DIAGNOSIS — E119 Type 2 diabetes mellitus without complications: Secondary | ICD-10-CM | POA: Insufficient documentation

## 2023-10-18 DIAGNOSIS — I2699 Other pulmonary embolism without acute cor pulmonale: Principal | ICD-10-CM | POA: Diagnosis present

## 2023-10-18 DIAGNOSIS — Z79899 Other long term (current) drug therapy: Secondary | ICD-10-CM | POA: Insufficient documentation

## 2023-10-18 DIAGNOSIS — I2609 Other pulmonary embolism with acute cor pulmonale: Secondary | ICD-10-CM | POA: Diagnosis present

## 2023-10-18 DIAGNOSIS — Z7901 Long term (current) use of anticoagulants: Secondary | ICD-10-CM | POA: Diagnosis not present

## 2023-10-18 DIAGNOSIS — Z85828 Personal history of other malignant neoplasm of skin: Secondary | ICD-10-CM | POA: Insufficient documentation

## 2023-10-18 HISTORY — PX: PULMONARY ANGIOGRAPHY: CATH118325

## 2023-10-18 LAB — CBC
HCT: 41.4 % (ref 36.0–46.0)
Hemoglobin: 13.5 g/dL (ref 12.0–15.0)
MCH: 26.5 pg (ref 26.0–34.0)
MCHC: 32.6 g/dL (ref 30.0–36.0)
MCV: 81.3 fL (ref 80.0–100.0)
Platelets: 226 10*3/uL (ref 150–400)
RBC: 5.09 MIL/uL (ref 3.87–5.11)
RDW: 14 % (ref 11.5–15.5)
WBC: 7.1 10*3/uL (ref 4.0–10.5)
nRBC: 0 % (ref 0.0–0.2)

## 2023-10-18 LAB — GLUCOSE, CAPILLARY: Glucose-Capillary: 134 mg/dL — ABNORMAL HIGH (ref 70–99)

## 2023-10-18 LAB — BASIC METABOLIC PANEL WITH GFR
Anion gap: 10 (ref 5–15)
BUN: 11 mg/dL (ref 6–20)
CO2: 21 mmol/L — ABNORMAL LOW (ref 22–32)
Calcium: 8.5 mg/dL — ABNORMAL LOW (ref 8.9–10.3)
Chloride: 107 mmol/L (ref 98–111)
Creatinine, Ser: 0.7 mg/dL (ref 0.44–1.00)
GFR, Estimated: >60 mL/min (ref >60)
Glucose, Bld: 150 mg/dL — ABNORMAL HIGH (ref 70–99)
Potassium: 3.8 mmol/L (ref 3.5–5.1)
Sodium: 138 mmol/L (ref 135–145)

## 2023-10-18 LAB — HIV ANTIBODY (ROUTINE TESTING W REFLEX): HIV Screen 4th Generation wRfx: NONREACTIVE

## 2023-10-18 SURGERY — PULMONARY ANGIOGRAPHY
Anesthesia: Moderate Sedation

## 2023-10-18 MED ORDER — HYDRALAZINE HCL 20 MG/ML IJ SOLN: 5.0000 mg | INTRAMUSCULAR | Status: DC | PRN

## 2023-10-18 MED ORDER — AMIODARONE HCL 150 MG/3ML IV SOLN
INTRAVENOUS | Status: AC
  Filled 2023-10-18: qty 3

## 2023-10-18 MED ORDER — METOPROLOL TARTRATE 25 MG PO TABS
25.0000 mg | ORAL_TABLET | Freq: Two times a day (BID) | ORAL | Status: DC
  Administered 2023-10-18: 25 mg via ORAL

## 2023-10-18 MED ORDER — HYDROMORPHONE HCL 1 MG/ML IJ SOLN
0.5000 mg | INTRAMUSCULAR | Status: DC | PRN
Start: 2023-10-18 — End: 2023-10-18

## 2023-10-18 MED ORDER — METOPROLOL TARTRATE 50 MG PO TABS: 25.0000 mg | ORAL_TABLET | Freq: Two times a day (BID) | ORAL | Status: DC

## 2023-10-18 MED ORDER — AMIODARONE HCL IN DEXTROSE 360-4.14 MG/200ML-% IV SOLN: 30.0000 mg/h | INTRAVENOUS | Status: DC

## 2023-10-18 MED ORDER — CEFAZOLIN SODIUM-DEXTROSE 2-4 GM/100ML-% IV SOLN
INTRAVENOUS | Status: AC
Start: 2023-10-18 — End: ?
  Filled 2023-10-18: qty 100

## 2023-10-18 MED ORDER — DEXTROSE 5 % IV SOLN
INTRAVENOUS | Status: DC | PRN
  Administered 2023-10-18: 150 mg via INTRAVENOUS

## 2023-10-18 MED ORDER — PANTOPRAZOLE SODIUM 40 MG PO TBEC: 40.0000 mg | DELAYED_RELEASE_TABLET | Freq: Every day | ORAL | Status: DC

## 2023-10-18 MED ORDER — HEPARIN SODIUM (PORCINE) 1000 UNIT/ML IJ SOLN
INTRAMUSCULAR | Status: AC
  Filled 2023-10-18: qty 10

## 2023-10-18 MED ORDER — CEFAZOLIN SODIUM-DEXTROSE 2-4 GM/100ML-% IV SOLN: 2.0000 g | INTRAVENOUS | Status: DC

## 2023-10-18 MED ORDER — METHYLPREDNISOLONE SODIUM SUCC 125 MG IJ SOLR: 125.0000 mg | Freq: Once | INTRAMUSCULAR | Status: DC | PRN

## 2023-10-18 MED ORDER — MIDAZOLAM HCL 2 MG/2ML IJ SOLN
INTRAMUSCULAR | Status: AC
  Filled 2023-10-18: qty 2

## 2023-10-18 MED ORDER — HYDROMORPHONE HCL 1 MG/ML IJ SOLN: 1.0000 mg | Freq: Once | INTRAMUSCULAR | Status: DC | PRN

## 2023-10-18 MED ORDER — HEPARIN SODIUM (PORCINE) 1000 UNIT/ML IJ SOLN
INTRAMUSCULAR | Status: DC | PRN
  Administered 2023-10-18: 3000 [IU] via INTRAVENOUS

## 2023-10-18 MED ORDER — FENTANYL CITRATE PF 50 MCG/ML IJ SOSY
PREFILLED_SYRINGE | INTRAMUSCULAR | Status: AC
  Filled 2023-10-18: qty 1

## 2023-10-18 MED ORDER — ALPRAZOLAM 0.5 MG PO TABS: 0.2500 mg | ORAL_TABLET | Freq: Three times a day (TID) | ORAL | Status: DC | PRN

## 2023-10-18 MED ORDER — METOPROLOL TARTRATE 5 MG/5ML IV SOLN: 2.0000 mg | INTRAVENOUS | Status: DC | PRN

## 2023-10-18 MED ORDER — ALUM & MAG HYDROXIDE-SIMETH 200-200-20 MG/5ML PO SUSP: 15.0000 mL | ORAL | Status: DC | PRN

## 2023-10-18 MED ORDER — METOPROLOL SUCCINATE ER 50 MG PO TB24: 25.0000 mg | ORAL_TABLET | Freq: Every day | ORAL | Status: DC

## 2023-10-18 MED ORDER — DILTIAZEM HCL 25 MG/5ML IV SOLN
INTRAVENOUS | Status: DC | PRN
  Administered 2023-10-18: 25 mg via INTRAVENOUS

## 2023-10-18 MED ORDER — PANTOPRAZOLE SODIUM 40 MG PO TBEC
40.0000 mg | DELAYED_RELEASE_TABLET | Freq: Every day | ORAL | Status: DC
Start: 2023-10-18 — End: 2023-10-18

## 2023-10-18 MED ORDER — AMIODARONE HCL IN DEXTROSE 360-4.14 MG/200ML-% IV SOLN
INTRAVENOUS | Status: AC
  Filled 2023-10-18: qty 200

## 2023-10-18 MED ORDER — CEFAZOLIN SODIUM-DEXTROSE 1-4 GM/50ML-% IV SOLN
INTRAVENOUS | Status: DC | PRN
  Administered 2023-10-18: 2 g via INTRAVENOUS

## 2023-10-18 MED ORDER — SODIUM CHLORIDE 0.9 % IV SOLN: INTRAVENOUS | Status: DC

## 2023-10-18 MED ORDER — FAMOTIDINE 20 MG PO TABS: 40.0000 mg | ORAL_TABLET | Freq: Once | ORAL | Status: DC | PRN

## 2023-10-18 MED ORDER — POTASSIUM CHLORIDE CRYS ER 20 MEQ PO TBCR
20.0000 meq | EXTENDED_RELEASE_TABLET | Freq: Once | ORAL | Status: DC
Start: 2023-10-18 — End: 2023-10-18

## 2023-10-18 MED ORDER — DILTIAZEM HCL 25 MG/5ML IV SOLN
INTRAVENOUS | Status: AC
  Filled 2023-10-18: qty 5

## 2023-10-18 MED ORDER — FENTANYL CITRATE (PF) 100 MCG/2ML IJ SOLN
INTRAMUSCULAR | Status: DC | PRN
  Administered 2023-10-18: 25 ug via INTRAVENOUS
  Administered 2023-10-18: 50 ug via INTRAVENOUS

## 2023-10-18 MED ORDER — LIDOCAINE-EPINEPHRINE (PF) 1 %-1:200000 IJ SOLN
INTRAMUSCULAR | Status: DC | PRN
  Administered 2023-10-18: 10 mL via INTRADERMAL

## 2023-10-18 MED ORDER — APIXABAN 5 MG PO TABS
5.0000 mg | ORAL_TABLET | Freq: Two times a day (BID) | ORAL | Status: DC
  Administered 2023-10-18 – 2023-10-19 (×2): 5 mg via ORAL
  Filled 2023-10-18 (×2): qty 1

## 2023-10-18 MED ORDER — GUAIFENESIN-DM 100-10 MG/5ML PO SYRP: 15.0000 mL | ORAL_SOLUTION | ORAL | Status: DC | PRN

## 2023-10-18 MED ORDER — METOPROLOL TARTRATE 50 MG PO TABS
ORAL_TABLET | ORAL | Status: AC
Start: 2023-10-18 — End: ?
  Filled 2023-10-18: qty 1

## 2023-10-18 MED ORDER — MIDAZOLAM HCL 2 MG/ML PO SYRP
8.0000 mg | ORAL_SOLUTION | Freq: Once | ORAL | Status: DC | PRN
Start: 2023-10-18 — End: 2023-10-18

## 2023-10-18 MED ORDER — HEPARIN (PORCINE) IN NACL 2000-0.9 UNIT/L-% IV SOLN
INTRAVENOUS | Status: DC | PRN
  Administered 2023-10-18: 1000 mL

## 2023-10-18 MED ORDER — LOSARTAN POTASSIUM 25 MG PO TABS: 25.0000 mg | ORAL_TABLET | Freq: Every day | ORAL | Status: DC

## 2023-10-18 MED ORDER — DIPHENHYDRAMINE HCL 50 MG/ML IJ SOLN: 50.0000 mg | Freq: Once | INTRAMUSCULAR | Status: DC | PRN

## 2023-10-18 MED ORDER — AMIODARONE LOAD VIA INFUSION
150.0000 mg | Freq: Once | INTRAVENOUS | Status: DC
Start: 2023-10-18 — End: 2023-10-18
  Filled 2023-10-18: qty 83.34

## 2023-10-18 MED ORDER — ATORVASTATIN CALCIUM 10 MG PO TABS: 10.0000 mg | ORAL_TABLET | Freq: Every day | ORAL | Status: DC

## 2023-10-18 MED ORDER — POLYETHYLENE GLYCOL 3350 17 G PO PACK: 17.0000 g | PACK | Freq: Every day | ORAL | Status: DC | PRN

## 2023-10-18 MED ORDER — AMIODARONE HCL IN DEXTROSE 360-4.14 MG/200ML-% IV SOLN
60.0000 mg/h | INTRAVENOUS | Status: DC
  Administered 2023-10-18: 60 mg/h via INTRAVENOUS

## 2023-10-18 MED ORDER — LABETALOL HCL 5 MG/ML IV SOLN: 10.0000 mg | INTRAVENOUS | Status: DC | PRN

## 2023-10-18 MED ORDER — MIDAZOLAM HCL 2 MG/2ML IJ SOLN
INTRAMUSCULAR | Status: DC | PRN
  Administered 2023-10-18: 2 mg via INTRAVENOUS
  Administered 2023-10-18: 1 mg via INTRAVENOUS

## 2023-10-18 MED ORDER — ONDANSETRON HCL 4 MG/2ML IJ SOLN: 4.0000 mg | Freq: Four times a day (QID) | INTRAMUSCULAR | Status: DC | PRN

## 2023-10-18 MED ORDER — GLIPIZIDE ER 10 MG PO TB24: 20.0000 mg | ORAL_TABLET | Freq: Every day | ORAL | Status: DC

## 2023-10-18 MED ORDER — TRAMADOL HCL 50 MG PO TABS: 50.0000 mg | ORAL_TABLET | Freq: Four times a day (QID) | ORAL | Status: DC | PRN

## 2023-10-18 MED ORDER — IODIXANOL 320 MG/ML IV SOLN
INTRAVENOUS | Status: DC | PRN
Start: 2023-10-18 — End: 2023-10-18
  Administered 2023-10-18: 75 mL via INTRAVENOUS

## 2023-10-18 MED ORDER — ALBUTEROL SULFATE (2.5 MG/3ML) 0.083% IN NEBU: 2.5000 mg | INHALATION_SOLUTION | RESPIRATORY_TRACT | Status: DC | PRN

## 2023-10-18 MED ORDER — PHENOL 1.4 % MT LIQD: 1.0000 | OROMUCOSAL | Status: DC | PRN

## 2023-10-18 MED ORDER — DAPAGLIFLOZIN PROPANEDIOL 10 MG PO TABS: 10.0000 mg | ORAL_TABLET | Freq: Every day | ORAL | Status: DC

## 2023-10-18 SURGICAL SUPPLY — 17 items
CANISTER PENUMBRA ENGINE (MISCELLANEOUS) IMPLANT
CATH ANGIO 5F PIGTAIL 100CM (CATHETERS) IMPLANT
CATH INDIGO 12XTORQ 100 (CATHETERS) IMPLANT
CATH INDIGO SEP 12 (CATHETERS) IMPLANT
CATH INFINITI JR4 5F (CATHETERS) IMPLANT
CATH SELECT BERN TIP 5F 130 (CATHETERS) IMPLANT
CLOSURE PERCLOSE PROSTYLE (VASCULAR PRODUCTS) IMPLANT
COVER PROBE ULTRASOUND 5X96 (MISCELLANEOUS) IMPLANT
GLIDEWIRE ADV .035X180CM (WIRE) IMPLANT
INTRODUCER PERFORM 12FR X13 (SHEATH) IMPLANT
PACK ANGIOGRAPHY (CUSTOM PROCEDURE TRAY) ×1 IMPLANT
SHEATH BRITE TIP 6FRX11 (SHEATH) IMPLANT
SUT MNCRL AB 4-0 PS2 18 (SUTURE) IMPLANT
SYR MEDRAD MARK 7 150ML (SYRINGE) IMPLANT
TUBING CONTRAST HIGH PRESS 72 (TUBING) IMPLANT
WIRE J 3MM .035X145CM (WIRE) IMPLANT
WIRE SUPRACORE 300CM (WIRE) IMPLANT

## 2023-10-18 NOTE — Interval H&P Note (Signed)
 History and Physical Interval Note:  10/18/2023 2:27 PM  Jessica Tran  has presented today for surgery, with the diagnosis of Thrombectomy   Pulmonary embolism.  The various methods of treatment have been discussed with the patient and family. After consideration of risks, benefits and other options for treatment, the patient has consented to  Procedure(s): PULMONARY ANGIOGRAPHY (N/A) as a surgical intervention.  The patient's history has been reviewed, patient examined, no change in status, stable for surgery.  I have reviewed the patient's chart and labs.  Questions were answered to the patient's satisfaction.     Chrishauna Mee

## 2023-10-18 NOTE — Progress Notes (Signed)
 Theadore Finger, PA-C at bedside currently.  Dr. Vonna Guardian at bedside about 5 minutes ago discussing procedure and overnight stay with pt and family.

## 2023-10-18 NOTE — Consult Note (Signed)
 [facility] CLINIC CARDIOLOGY CONSULT NOTE       Patient ID: Jessica Tran MRN: [id number] DOB/AGE: 1966/04/11 58 y.o.  Admit date: 10/18/2023 Referring Physician Dr. Mikki Alexander Primary Physician Melchor Spoon, MD  Primary Cardiologist Dr. Larinda Plover Reason for Consultation Atrial fibrillation RVR  HPI: Jessica Tran is a 58 y.o. female  with a past medical history of hyperlipidemia, type II diabetes, palpitations who was recently diagnosed with PE but declined thrombectomy. Due to continue symptoms she underwent outpatient thrombectomy on 10/18/2023 and during this procedure developed atrial fibrillation RVR. Cardiology was consulted for further evaluation.   Patient was admitted on 09/30/2023 complaints of chest pain, shortness of breath, dry cough and was found to have bilateral PEs at that time with evidence of right heart strain.  Seen by vascular surgery during that admission and declined thrombectomy.  Over the last few weeks she has had persistent shortness of breath symptoms and decided to proceed with outpatient thrombectomy.  This was done today and during the procedure she developed atrial fibrillation RVR.  Initially was treated with IV diltiazem  without improvement in was thus started on IV amiodarone .  She has since converted to sinus rhythm.  No lab work for review today.  No EKG for review.  At the time of my evaluation this afternoon she is resting in stretcher in specials recovery with family present at bedside. States that she is feeling much better overall now following her procedure. Denies any chest pain. States SOB has resolved but remains on supplemental O2.  States that she did have some palpitations earlier during the procedure but these have now resolved.  Review of systems complete and found to be negative unless listed above    Past Medical History:  Diagnosis Date   Anxiety    Arthritis    Bilateral lower extremity edema    Cancer (HCC) 2019   melanoma    Cardiac murmur    Diabetes mellitus without complication (HCC)    Pt takes Metformin  and glipizide    Diverticulitis of colon    GERD (gastroesophageal reflux disease)    Hepatic steatosis 12/22/2015   Morbid obesity (HCC) 03/17/2015   Obesity, Class II, BMI 35.0-39.9, with comorbidity (see actual BMI)     Past Surgical History:  Procedure Laterality Date   ABDOMINAL HYSTERECTOMY  12/18/2001   LAPROSCOPIC SUPRACERVICAL WITH LYSES OF ADHESIONS   ARTHROSCOPIC REPAIR ACL Right 07/10/2012   MCL TEAR 1995   BARTHOLIN GLAND CYST EXCISION  1995   CHOLECYSTECTOMY  01/01/2004   LAPROSCOPIC   COLONOSCOPY WITH PROPOFOL  N/A 06/24/2016   Procedure: COLONOSCOPY WITH PROPOFOL ;  Surgeon: Cassie Click, MD;  Location: [facility] ENDOSCOPY;  Service: Endoscopy;  Laterality: N/A;   ESOPHAGOGASTRODUODENOSCOPY (EGD) WITH PROPOFOL  N/A 10/21/2020   Procedure: ESOPHAGOGASTRODUODENOSCOPY (EGD) WITH PROPOFOL ;  Surgeon: Selena Daily, MD;  Location: [facility] ENDOSCOPY;  Service: Gastroenterology;  Laterality: N/A;   LAPAROSCOPIC OOPHERECTOMY Left 05/15/2001   LYSIS OF ADHESION  05/15/2001   LAPARSCOPIC   SALPINGECTOMY Left 05/15/2001   LAPROSCOPIC   SEPTOSTOMY  08/17/2006   SHOULDER ARTHROSCOPY WITH OPEN ROTATOR CUFF REPAIR Right 03/18/2022   Procedure: Right shoulder arthroscopic rotator cuff repair, subacromial decompression, distal clavicle excision, and biceps tenodesis;  Surgeon: Lorri Rota, MD;  Location: [facility];  Service: Orthopedics;  Laterality: Right;   TONSILLECTOMY  09/27/1991    Medications Prior to Admission  Medication Sig Dispense Refill Last Dose/Taking   albuterol  (VENTOLIN  HFA) 108 (90 Base) MCG/ACT inhaler Inhale  into the lungs.   Past Month   ALPRAZolam  (XANAX ) 0.25 MG tablet alprazolam  0.25 mg tablet   10/17/2023   APIXABAN  (ELIQUIS ) VTE STARTER PACK (10MG  AND 5MG ) Take as directed on package: start with two-5mg  tablets twice daily for 7 days. On day 8, switch to one-5mg  tablet  twice daily. 74 each 0 10/17/2023   atorvastatin  (LIPITOR) 10 MG tablet Take 1 tablet by mouth daily.   10/18/2023   Barberry-Oreg Grape-Goldenseal (BERBERINE COMPLEX PO) Take 1,200 mg by mouth daily.   Past Week   FARXIGA  10 MG TABS tablet Take 1 tablet by mouth daily.   10/18/2023   glipiZIDE  (GLIPIZIDE  XL) 10 MG 24 hr tablet Take 2 tablets (20 mg total) by mouth daily with breakfast. 60 tablet 5 10/18/2023   losartan  (COZAAR ) 25 MG tablet Take 1 tablet by mouth daily.   10/17/2023   omeprazole  (PRILOSEC) 40 MG capsule TAKE 1 CAPSULE BY MOUTH 2 TIMES DAILY BEFORE A MEAL (Patient taking differently: 20 mg daily.) 60 capsule 5 10/18/2023   Semaglutide,0.25 or 0.5MG /DOS, (OZEMPIC, 0.25 OR 0.5 MG/DOSE,) 2 MG/1.5ML SOPN Inject into the skin once a week.   10/13/2023   GE100 BLOOD GLUCOSE TEST test strip USE TO TEST THREE TIMES DAILY      ondansetron  (ZOFRAN ) 4 MG tablet Take 1 tablet (4 mg total) by mouth every 8 (eight) hours as needed. (Patient not taking: Reported on 10/18/2023) 20 tablet 0 Not Taking   Social History   Socioeconomic History   Marital status: Married    Spouse name: Not on file   Number of children: Not on file   Years of education: Not on file   Highest education level: Not on file  Occupational History   Not on file  Tobacco Use   Smoking status: Never   Smokeless tobacco: Never  Vaping Use   Vaping status: Never Used  Substance and Sexual Activity   Alcohol use: Yes    Alcohol/week: 0.0 standard drinks of alcohol    Comment: occasionally   Drug use: No   Sexual activity: Not Currently    Partners: Male  Other Topics Concern   Not on file  Social History Narrative   Not on file   Social Drivers of Health   Financial Resource Strain: Low Risk  (03/14/2023)   Received from [facility]   Overall Financial Resource Strain (CARDIA)    Difficulty of Paying Living Expenses: Not hard at all  Food Insecurity: No Food Insecurity (09/30/2023)   Hunger Vital  Sign    Worried About Running Out of Food in the Last Year: Never true    Ran Out of Food in the Last Year: Never true  Transportation Needs: No Transportation Needs (09/30/2023)   PRAPARE - Administrator, Civil Service (Medical): No    Lack of Transportation (Non-Medical): No  Physical Activity: Not on file  Stress: Not on file  Social Connections: Unknown (09/30/2023)   Social Connection and Isolation Panel [NHANES]    Frequency of Communication with Friends and Family: Not on file    Frequency of Social Gatherings with Friends and Family: Not on file    Attends Religious Services: Not on file    Active Member of Clubs or Organizations: Not on file    Attends Banker Meetings: Not on file    Marital Status: Married  Intimate Partner Violence: Not At Risk (09/30/2023)   Humiliation, Afraid, Rape, and Kick questionnaire  Fear of Current or Ex-Partner: No    Emotionally Abused: No    Physically Abused: No    Sexually Abused: No    Family History  Problem Relation Age of Onset   Hyperlipidemia Mother    Hypertension Mother    Diabetes Mother    Hyperlipidemia Father    Hypertension Father    Breast cancer Neg Hx      Vitals:   10/18/23 1517 10/18/23 1522 10/18/23 1527 10/18/23 1532  BP: (!) 141/87 (!) 140/76 113/78 123/75  Pulse: (!) 112 (!) 106 99 98  Resp: (!) 23 20 20 19   Temp:      TempSrc:      SpO2: 90% 90% 90% (!) 89%  Weight:      Height:        PHYSICAL EXAM General: Well-appearing female, well nourished, in no acute distress. HEENT: Normocephalic and atraumatic. Neck: No JVD.  Lungs: Normal respiratory effort on 4L [place]. Clear bilaterally to auscultation. No wheezes, crackles, rhonchi.  Heart: HRRR. Normal S1 and S2 without gallops or murmurs.  Abdomen: Non-distended appearing.  Msk: Normal strength and tone for age. Extremities: Warm and well perfused. No clubbing, cyanosis.  No edema.  Neuro: Alert and oriented X 3. Psych: Answers  questions appropriately.   Labs: Basic Metabolic Panel: Recent Labs    10/16/23 1553  NA 137  K 3.5  CL 104  CO2 23  GLUCOSE 109*  BUN 18  CREATININE 0.68  CALCIUM  9.1   Liver Function Tests: No results for input(s): "AST", "ALT", "ALKPHOS", "BILITOT", "PROT", "ALBUMIN" in the last 72 hours. No results for input(s): "LIPASE", "AMYLASE" in the last 72 hours. CBC: Recent Labs    10/16/23 1553  WBC 9.2  HGB 14.1  HCT 43.5  MCV 80.9  PLT 250   Cardiac Enzymes: Recent Labs    10/16/23 1553 10/16/23 2213  TROPONINIHS 4 4   BNP: No results for input(s): "BNP" in the last 72 hours. D-Dimer: No results for input(s): "DDIMER" in the last 72 hours. Hemoglobin A1C: No results for input(s): "HGBA1C" in the last 72 hours. Fasting Lipid Panel: No results for input(s): "CHOL", "HDL", "LDLCALC", "TRIG", "CHOLHDL", "LDLDIRECT" in the last 72 hours. Thyroid Function Tests: No results for input(s): "TSH", "T4TOTAL", "T3FREE", "THYROIDAB" in the last 72 hours.  Invalid input(s): "FREET3" Anemia Panel: No results for input(s): "VITAMINB12", "FOLATE", "FERRITIN", "TIBC", "IRON", "RETICCTPCT" in the last 72 hours.   Radiology: PERIPHERAL VASCULAR CATHETERIZATION Result Date: 10/18/2023 See surgical note for result.  DG Chest 2 View Result Date: 10/16/2023 CLINICAL DATA:  Chest pain. EXAM: CHEST - 2 VIEW COMPARISON:  September 30, 2023 FINDINGS: The heart size and mediastinal contours are within normal limits. Both lungs are clear. Radiopaque surgical clips are seen within the right upper quadrant. The visualized skeletal structures are unremarkable. IMPRESSION: No active cardiopulmonary disease. Electronically Signed   By: Virgle Grime M.D.   On: 10/16/2023 19:54   CT Angio Chest Pulmonary Embolism (PE) W or WO Contrast Result Date: 10/02/2023 CLINICAL DATA:  Follow-up pulmonary embolus. Exertional shortness of breath. EXAM: CT ANGIOGRAPHY CHEST WITH CONTRAST TECHNIQUE: Multidetector  CT imaging of the chest was performed using the standard protocol during bolus administration of intravenous contrast. Multiplanar CT image reconstructions and MIPs were obtained to evaluate the vascular anatomy. RADIATION DOSE REDUCTION: This exam was performed according to the departmental dose-optimization program which includes automated exposure control, adjustment of the mA and/or kV according to patient size and/or use of  iterative reconstruction technique. CONTRAST:  75mL OMNIPAQUE  IOHEXOL  350 MG/ML SOLN COMPARISON:  Chest CT 2 days ago FINDINGS: Cardiovascular: Known pulmonary emboli, slight decrease in thromboembolic burden over the last 2 days. Moderate-large clot burden persists with filling defects involving the distal main, all lobar and many segmental branches. Persistent but decreased right heart strain, RV to LV ratio is 1.13, previously 1.4. There is similar dilatation of the main pulmonary artery. Contrast refluxes into the hepatic veins and IVC. No pericardial effusion. Mediastinum/Nodes: No enlarged mediastinal or hilar lymph nodes. Unremarkable thyroid gland. Decompressed esophagus. Lungs/Pleura: Small subpleural opacity in the dependent right lower lobe, series 6, image 91, likely a small pulmonary infarct. Additional dependent atelectasis within the lower lobes. No pleural effusion. Breathing motion artifact limits detailed assessment Upper Abdomen: Hepatic steatosis. No acute upper abdominal findings. Musculoskeletal: There are no acute or suspicious osseous abnormalities. Review of the MIP images confirms the above findings. IMPRESSION: 1. Known pulmonary emboli, slight decrease in thromboembolic burden over the last 2 days. Moderate-large clot burden persists with filling defects involving the distal main, all lobar and numerous segmental branches. 2. Persistent but decreased right heart strain, RV to LV ratio is 1.13, previously 1.4. 3. Small subpleural opacity in the dependent right  lower lobe, likely a small pulmonary infarct. 4. Hepatic steatosis. Electronically Signed   By: Chadwick Colonel M.D.   On: 10/02/2023 15:20   ECHOCARDIOGRAM COMPLETE Result Date: 10/01/2023    ECHOCARDIOGRAM REPORT   Patient Name:   Jessica Tran [facility] Date of Exam: 10/01/2023 Medical Rec #:  [id number]         Height:       67.0 in Accession #:    [id number]        Weight:       263.9 lb Date of Birth:  04/22/1966          BSA:          2.275 m Patient Age:    57 years          BP:           141/73 mmHg Patient Gender: F                 HR:           84 bpm. Exam Location:  [facility] Procedure: 2D Echo, Cardiac Doppler and Color Doppler (Both Spectral and Color            Flow Doppler were utilized during procedure). Indications:     Pulmonary Embolus I26.09  History:         Patient has no prior history of Echocardiogram examinations.  Sonographer:     Jane Meager RDCS Referring Phys:  [id number] AMY N [facility] Diagnosing Phys: Percival Brace MD  Sonographer Comments: Suboptimal subcostal window. Image acquisition challenging due to patient body habitus and Image acquisition challenging due to respiratory motion. IMPRESSIONS  1. Left ventricular ejection fraction, by estimation, is 55 to 60%. The left ventricle has normal function. The left ventricle has no regional wall motion abnormalities. Left ventricular diastolic parameters are consistent with Grade I diastolic dysfunction (impaired relaxation).  2. Right ventricular systolic function is normal. The right ventricular size is normal.  3. The mitral valve is normal in structure. Trivial mitral valve regurgitation. No evidence of mitral stenosis.  4. The aortic valve is normal in structure. Aortic valve regurgitation is not visualized. No aortic stenosis is present.  5. The inferior vena cava is normal in size  with greater than 50% respiratory variability, suggesting right atrial pressure of 3 mmHg. FINDINGS  Left Ventricle: Left ventricular ejection fraction, by  estimation, is 55 to 60%. The left ventricle has normal function. The left ventricle has no regional wall motion abnormalities. Strain was performed and the global longitudinal strain is indeterminate. The left ventricular internal cavity size was normal in size. There is no left ventricular hypertrophy. Left ventricular diastolic parameters are consistent with Grade I diastolic dysfunction (impaired relaxation). Right Ventricle: The right ventricular size is normal. No increase in right ventricular wall thickness. Right ventricular systolic function is normal. Left Atrium: Left atrial size was normal in size. Right Atrium: Right atrial size was normal in size. Pericardium: There is no evidence of pericardial effusion. Mitral Valve: The mitral valve is normal in structure. Trivial mitral valve regurgitation. No evidence of mitral valve stenosis. Tricuspid Valve: The tricuspid valve is normal in structure. Tricuspid valve regurgitation is trivial. No evidence of tricuspid stenosis. Aortic Valve: The aortic valve is normal in structure. Aortic valve regurgitation is not visualized. No aortic stenosis is present. Aortic valve peak gradient measures 9.9 mmHg. Pulmonic Valve: The pulmonic valve was normal in structure. Pulmonic valve regurgitation is not visualized. No evidence of pulmonic stenosis. Aorta: The aortic root is normal in size and structure. Venous: The inferior vena cava is normal in size with greater than 50% respiratory variability, suggesting right atrial pressure of 3 mmHg. IAS/Shunts: No atrial level shunt detected by color flow Doppler. Additional Comments: 3D was performed not requiring image post processing on an independent workstation and was indeterminate.  LEFT VENTRICLE PLAX 2D LVIDd:         3.60 cm   Diastology LVIDs:         2.50 cm   LV e' medial:    9.36 cm/s LV PW:         1.10 cm   LV E/e' medial:  8.1 LV IVS:        1.00 cm   LV e' lateral:   14.50 cm/s LVOT diam:     1.90 cm   LV E/e'  lateral: 5.2 LV SV:         52 LV SV Index:   23 LVOT Area:     2.84 cm  RIGHT VENTRICLE RV Basal diam:  3.30 cm RV S prime:     9.90 cm/s TAPSE (M-mode): 1.8 cm LEFT ATRIUM             Index        RIGHT ATRIUM           Index LA diam:        4.00 cm 1.76 cm/m   RA Area:     20.30 cm LA Vol (A2C):   46.4 ml 20.39 ml/m  RA Volume:   60.10 ml  26.42 ml/m LA Vol (A4C):   42.5 ml 18.68 ml/m LA Biplane Vol: 44.7 ml 19.65 ml/m  AORTIC VALVE                 PULMONIC VALVE AV Area (Vmax): 1.90 cm     PV Vmax:        1.34 m/s AV Vmax:        157.00 cm/s  PV Peak grad:   7.2 mmHg AV Peak Grad:   9.9 mmHg     RVOT Peak grad: 2 mmHg LVOT Vmax:      105.00 cm/s LVOT Vmean:     67.300 cm/s  LVOT VTI:       0.184 m  AORTA Ao Root diam: 2.50 cm Ao Asc diam:  2.80 cm MITRAL VALVE               TRICUSPID VALVE MV Area (PHT): 3.68 cm    TR Peak grad:   29.4 mmHg MV Decel Time: 206 msec    TR Vmax:        271.00 cm/s MV E velocity: 75.50 cm/s MV A velocity: 88.00 cm/s  SHUNTS MV E/A ratio:  0.86        Systemic VTI:  0.18 m                            Systemic Diam: 1.90 cm Percival Brace MD Electronically signed by Percival Brace MD Signature Date/Time: 10/01/2023/12:17:31 PM    Final    US  Venous Img Lower Bilateral (DVT) Result Date: 09/30/2023 CLINICAL DATA:  Pulmonary embolism. EXAM: BILATERAL LOWER EXTREMITY VENOUS DOPPLER ULTRASOUND TECHNIQUE: Gray-scale sonography with graded compression, as well as color Doppler and duplex ultrasound were performed to evaluate the lower extremity deep venous systems from the level of the common femoral vein and including the common femoral, femoral, profunda femoral, popliteal and calf veins including the posterior tibial, peroneal and gastrocnemius veins when visible. The superficial great saphenous vein was also interrogated. Spectral Doppler was utilized to evaluate flow at rest and with distal augmentation maneuvers in the common femoral, femoral and popliteal veins.  COMPARISON:  None Available. FINDINGS: RIGHT LOWER EXTREMITY Common Femoral Vein: No evidence of thrombus. Normal compressibility, respiratory phasicity and response to augmentation. Saphenofemoral Junction: No evidence of thrombus. Normal compressibility and flow on color Doppler imaging. Profunda Femoral Vein: No evidence of thrombus. Normal compressibility and flow on color Doppler imaging. Femoral Vein: No evidence of thrombus. Normal compressibility, respiratory phasicity and response to augmentation. Popliteal Vein: Nonocclusive thrombus. Calf Veins: No evidence of thrombus. Normal compressibility and flow on color Doppler imaging. Superficial Great Saphenous Vein: No evidence of thrombus. Normal compressibility. Other Findings: Small amount of fluid in the popliteal fossa typical of Baker cyst. LEFT LOWER EXTREMITY Common Femoral Vein: No evidence of thrombus. Normal compressibility, respiratory phasicity and response to augmentation. Saphenofemoral Junction: No evidence of thrombus. Normal compressibility and flow on color Doppler imaging. Profunda Femoral Vein: No evidence of thrombus. Normal compressibility and flow on color Doppler imaging. Femoral Vein: No evidence of thrombus. Normal compressibility, respiratory phasicity and response to augmentation. Popliteal Vein: No evidence of thrombus. Normal compressibility, respiratory phasicity and response to augmentation. Calf Veins: No evidence of thrombus. Normal compressibility and flow on color Doppler imaging. Superficial Great Saphenous Vein: No evidence of thrombus. Normal compressibility. IMPRESSION: 1. Nonocclusive thrombus within the right popliteal vein. 2. No evidence of left lower extremity DVT. Electronically Signed   By: Chadwick Colonel M.D.   On: 09/30/2023 18:49   CT Angio Chest PE W and/or Wo Contrast Result Date: 09/30/2023 CLINICAL DATA:  Shortness of breath. Concern for pulmonary embolism. EXAM: CT ANGIOGRAPHY CHEST WITH CONTRAST  TECHNIQUE: Multidetector CT imaging of the chest was performed using the standard protocol during bolus administration of intravenous contrast. Multiplanar CT image reconstructions and MIPs were obtained to evaluate the vascular anatomy. RADIATION DOSE REDUCTION: This exam was performed according to the departmental dose-optimization program which includes automated exposure control, adjustment of the mA and/or kV according to patient size and/or use of iterative reconstruction technique. CONTRAST:  75mL OMNIPAQUE  IOHEXOL   350 MG/ML SOLN COMPARISON:  Chest radiograph dated 09/30/2023 and CT dated 08/12/2021. FINDINGS: Cardiovascular: There is no cardiomegaly or pericardial effusion. There is dilatation of the right heart chambers with RV/LV ratio of approximately 1.4 indicative of right heart straining. The thoracic aorta is unremarkable. Bilateral pulmonary artery emboli involving the lobar and segmental branches of the lower lobes and upper lobes. Mediastinum/Nodes: No hilar or mediastinal adenopathy. The esophagus is grossly unremarkable. No mediastinal fluid collection. Lungs/Pleura: No focal consolidation, pleural effusion, pneumothorax. The central airways are patent. Upper Abdomen: Fatty liver.  Cholecystectomy. Musculoskeletal: No acute osseous pathology. Review of the MIP images confirms the above findings. IMPRESSION: Positive for acute PE with CT evidence of right heart strain (RV/LV Ratio = 1.4) consistent with at least submassive (intermediate risk) PE. The presence of right heart strain has been associated with an increased risk of morbidity and mortality. Please refer to the "Code PE Focused" order set in EPIC. These results were called by telephone at the time of interpretation on 09/30/2023 at 3:12 pm to Dr. Reinhold Carbine, who verbally acknowledged these results. Electronically Signed   By: Angus Bark M.D.   On: 09/30/2023 15:34   DG Chest 2 View Result Date: 09/30/2023 CLINICAL DATA:  Shortness of  breath EXAM: CHEST - 2 VIEW COMPARISON:  06/26/2021 x-ray.  CT 08/12/2021 FINDINGS: The heart size and mediastinal contours are within normal limits. No consolidation, pneumothorax or effusion. No edema. The visualized skeletal structures are unremarkable. IMPRESSION: No acute cardiopulmonary disease. Electronically Signed   By: Adrianna Horde M.D.   On: 09/30/2023 12:02    ECHO 10/01/2023: 1. Left ventricular ejection fraction, by estimation, is 55 to 60%. The left ventricle has normal function. The left ventricle has no regional wall motion abnormalities. Left ventricular diastolic parameters are consistent with Grade I diastolic dysfunction (impaired relaxation).   2. Right ventricular systolic function is normal. The right ventricular  size is normal.   3. The mitral valve is normal in structure. Trivial mitral valve  regurgitation. No evidence of mitral stenosis.   4. The aortic valve is normal in structure. Aortic valve regurgitation is  not visualized. No aortic stenosis is present.   5. The inferior vena cava is normal in size with greater than 50%  respiratory variability, suggesting right atrial pressure of 3 mmHg.   TELEMETRY reviewed by me 10/18/2023: Sinus rhythm rate 90s  EKG reviewed by me: Normal sinus rhythm rate 96 bpm, prolonged QT interval  Data reviewed by me 10/18/2023: last 24h vitals tele labs imaging I/O vascular surgery notes Principal Problem:   Pulmonary embolism (HCC)    ASSESSMENT AND PLAN:  Jessica Tran is a 58 y.o. female  with a past medical history of hyperlipidemia, type II diabetes, palpitations who was recently diagnosed with PE but declined thrombectomy. Due to continue symptoms she underwent outpatient thrombectomy on 10/18/2023 and during this procedure developed atrial fibrillation RVR. Cardiology was consulted for further evaluation.   # Atrial fibrillation RVR # Bilateral pulmonary emboli with right heart strain # Hypertension #  Hyperlipidemia Patient diagnosed with bilateral PEs earlier this month and initially declined mechanical thrombectomy however given persistent symptoms decided to proceed with this today.  During procedure she developed atrial fibrillation RVR which did not respond to IV diltiazem  and was ultimately started on IV amiodarone .  Now back in sinus rhythm. EKG this afternoon with NSR, prolonged QT. -Suspect episode of atrial fibrillation secondary to bilateral PEs with right heart strain. -Echo ordered to reassess  LV function, RV function.  -Start metoprolol  succinate 25 mg daily.  Will discontinue amiodarone  given prolonged QT on EKG. -Eliquis  to be resumed as per vascular surgery. -Continue atorvastatin  10 mg daily, losartan  25 mg daily.   This patient's plan of care was discussed and created with Dr. Beau Bound and he is in agreement.  Signed: Hamp Levine, PA-C  10/18/2023, 3:53 PM [facility]

## 2023-10-18 NOTE — Op Note (Signed)
 [facility]  Percutaneous Study/Intervention Procedural Note   Date of Surgery: 10/18/2023,3:43 PM  Surgeon: Mikki Alexander  Pre-operative Diagnosis: Symptomatic bilateral pulmonary emboli  Post-operative diagnosis:  Same  Procedure(s) Performed:  1.  Contrast injection right heart  2.  Mechanical thrombectomy left lower lobe pulmonary artery and right middle and lower lobe pulmonary arteries using the penumbra CAT 12 catheter  3.  Selective catheter placement left upper lobe and lower lobe pulmonary arteries  4.  Selective catheter placement right lower lobe, middle lobe, and upper lobe pulmonary arteries      Anesthesia: Conscious sedation was administered under my direct supervision by the interventional radiology RN. IV Versed  plus fentanyl  were utilized. Continuous ECG, pulse oximetry and blood pressure was monitored throughout the entire procedure.  Versed  and fentanyl  were administered intravenously.  Conscious sedation was administered for a total of 87 minutes using 3 mg of Versed  and 75 mcg of Fentanyl .  EBL: 250 cc  Sheath: 12 Fr Right femoral vein  Contrast: 75 cc   Fluoroscopy Time: 9.1 minutes  Indications:  Patient presents with pulmonary emboli. The patient is symptomatic with hypoxemia and dyspnea on exertion.  There is evidence of right heart strain on the CT angiogram. The patient is otherwise a good candidate for intervention and even the long-term benefits pulmonary angiography with thrombolysis is offered. The risks and benefits are reviewed long-term benefits are discussed. All questions are answered patient agrees to proceed.  Procedure:  Jessica Tran a 58 y.o. female who was identified and appropriate procedural time out was performed.  The patient was then placed supine on the table and prepped and draped in the usual sterile fashion.  Ultrasound was used to evaluate the right common femoral vein.  It was patent, as it was echolucent  and compressible.  A digital ultrasound image was acquired for the permanent record.  A Seldinger needle was used to access the right common femoral vein under direct ultrasound guidance.  A 0.035 J wire was advanced without resistance and a 6Fr sheath was placed. A proglide device was placed in a preclose fashion and then upsized to a 12 [place] sheath.    The Advantage wire and pigtail catheter were then negotiated into the right atrium and bolus injection of contrast was utilized to demonstrate the right ventricle and the pulmonary artery outflow. The Advantage wire and catheter were then negotiated into the the pulmonary arteries.  The left pulmonary artery was cannulated and advanced into the left upper lobe and left lower lobe for selective imaging. This demonstrated significant thrombus burden in the left lower lobe pulmonary artery as well as a smaller amount of clot in the left upper lobe pulmonary artery although image quality was poor due to motion artifact and size.  I then transitioned to the right side with the advantage wire and catheter and first cannulated the right lower lobe and then the right middle and upper lobes.  This demonstrated significant thrombus burden most notable in the right lower lobe pulmonary artery with a smaller amount of thrombus in the right middle and upper lobe pulmonary arteries.  Again, image quality was quite limited.  3000 units of heparin  was then given and allowed to circulate.   The Penumbra Cat 12 catheter was then advanced up into the pulmonary vasculature. The left lung was addressed first. Catheter was negotiated into the left lower lobe pulmonary artery and mechanical thrombectomy was performed with the help of the separator. Follow-up imaging  demonstrated a good result.  The Penumbra Cat 12 catheter was then negotiated to the opposite side. The right lung was then addressed. Catheter was negotiated into the right lower lobe pulmonary artery and  mechanical thrombectomy was performed with the separator. Follow-up imaging demonstrated a good result and therefore the catheter was renegotiated into the right middle lobe pulmonary artery and again mechanical thrombectomy was performed. Passes were made with both the Penumbra catheter itself as well as introducing the separator. Follow-up imaging was then performed.  Improvement was seen.  After review these images wires were reintroduced and the catheter is removed. Then, the sheath is then pulled, the proglide device is secured, a Monocryl skin suture was placed and pressure is held. A sterile dressing is placed.    Findings:   Right heart imaging:  Right atrium and right ventricle and the pulmonary outflow tract appears dilated  Right lung:  This demonstrated significant thrombus burden most notable in the right lower lobe pulmonary artery with a smaller amount of thrombus in the right middle and upper lobe pulmonary arteries.  Again, image quality was quite limited.  Left lung:  This demonstrated significant thrombus burden in the left lower lobe pulmonary artery as well as a smaller amount of clot in the left upper lobe pulmonary artery although image quality was poor due to motion artifact and size.    Disposition: Patient was taken to the recovery room in stable condition having tolerated the procedure well.  Mikki Alexander 10/18/2023,3:43 PM

## 2023-10-19 ENCOUNTER — Encounter (INDEPENDENT_AMBULATORY_CARE_PROVIDER_SITE_OTHER): Payer: Self-pay | Admitting: Vascular Surgery

## 2023-10-19 ENCOUNTER — Encounter: Payer: Self-pay | Admitting: Vascular Surgery

## 2023-10-19 DIAGNOSIS — I2609 Other pulmonary embolism with acute cor pulmonale: Secondary | ICD-10-CM | POA: Diagnosis not present

## 2023-10-19 MED ORDER — METOPROLOL TARTRATE 50 MG PO TABS: 50.0000 mg | ORAL_TABLET | Freq: Every day | ORAL | 6 refills | Status: AC

## 2023-10-19 MED ORDER — APIXABAN 5 MG PO TABS: 5.0000 mg | ORAL_TABLET | Freq: Two times a day (BID) | ORAL | 6 refills | Status: AC

## 2023-10-19 MED ORDER — METOPROLOL TARTRATE 50 MG PO TABS
50.0000 mg | ORAL_TABLET | Freq: Two times a day (BID) | ORAL | Status: DC
  Administered 2023-10-19: 50 mg via ORAL
  Filled 2023-10-19: qty 1

## 2023-10-19 NOTE — TOC CM/SW Note (Signed)
 Transition of Care [facility]) - Inpatient Brief Assessment   Patient Details  Name: Jessica BISAILLON MRN: [id number] Date of Birth: Nov 15, 1965  Transition of Care [facility]) CM/SW Contact:    Odilia Bennett, LCSW Phone Number: 10/19/2023, 1:22 PM   Clinical Narrative: Patient has orders to discharge home today. Chart reviewed. No TOC needs identified. CSW signing off.  Transition of Care Asessment: Insurance and Status: Insurance coverage has been reviewed Patient has primary care physician: Yes Home environment has been reviewed: Single family home Prior level of function:: Not documented Prior/Current Home Services: No current home services Social Drivers of Health Review: SDOH reviewed no interventions necessary Readmission risk has been reviewed: Yes Transition of care needs: no transition of care needs at this time

## 2023-10-19 NOTE — Plan of Care (Signed)
°  Problem: Clinical Measurements: Goal: Will remain free from infection Outcome: Progressing   Problem: Activity: Goal: Risk for activity intolerance will decrease Outcome: Progressing   Problem: Nutrition: Goal: Adequate nutrition will be maintained Outcome: Progressing

## 2023-10-19 NOTE — Progress Notes (Signed)
 [facility] CLINIC CARDIOLOGY PROGRESS NOTE       Patient ID: Jessica Tran MRN: [id number] DOB/AGE: [birth year] 60 y.o.  Admit date: 10/18/2023 Referring Physician Dr. Mikki Alexander Primary Physician Melchor Spoon, MD  Primary Cardiologist Dr. Larinda Plover Reason for Consultation Atrial fibrillation RVR  HPI: Jessica Tran is a 58 y.o. female  with a past medical history of hyperlipidemia, type II diabetes, palpitations who was recently diagnosed with PE but declined thrombectomy. Due to continue symptoms she underwent outpatient thrombectomy on 10/18/2023 and during this procedure developed atrial fibrillation RVR. Cardiology was consulted for further evaluation.   Interval history: -Patient seen and examined this morning, resting comfortably in bedside chair. -States that her shortness of breath is resolved and she also denies any chest pain or palpitations. -Remains in normal sinus rhythm on telemetry with no evidence of recurrence of atrial fibrillation.  Review of systems complete and found to be negative unless listed above    Past Medical History:  Diagnosis Date   Anxiety    Arthritis    Bilateral lower extremity edema    Cancer (HCC) 2019   melanoma   Cardiac murmur    Diabetes mellitus without complication (HCC)    Pt takes Metformin  and glipizide    Diverticulitis of colon    GERD (gastroesophageal reflux disease)    Hepatic steatosis 12/22/2015   Morbid obesity (HCC) 03/17/2015   Obesity, Class II, BMI 35.0-39.9, with comorbidity (see actual BMI)     Past Surgical History:  Procedure Laterality Date   ABDOMINAL HYSTERECTOMY  12/18/2001   LAPROSCOPIC SUPRACERVICAL WITH LYSES OF ADHESIONS   ARTHROSCOPIC REPAIR ACL Right 07/10/2012   MCL TEAR 1995   BARTHOLIN GLAND CYST EXCISION  1995   CHOLECYSTECTOMY  01/01/2004   LAPROSCOPIC   COLONOSCOPY WITH PROPOFOL  N/A 06/24/2016   Procedure: COLONOSCOPY WITH PROPOFOL ;  Surgeon: Cassie Click, MD;  Location: [facility]  ENDOSCOPY;  Service: Endoscopy;  Laterality: N/A;   ESOPHAGOGASTRODUODENOSCOPY (EGD) WITH PROPOFOL  N/A 10/21/2020   Procedure: ESOPHAGOGASTRODUODENOSCOPY (EGD) WITH PROPOFOL ;  Surgeon: Selena Daily, MD;  Location: [facility] ENDOSCOPY;  Service: Gastroenterology;  Laterality: N/A;   LAPAROSCOPIC OOPHERECTOMY Left 05/15/2001   LYSIS OF ADHESION  05/15/2001   LAPARSCOPIC   PULMONARY ANGIOGRAPHY N/A 10/18/2023   Procedure: PULMONARY ANGIOGRAPHY;  Surgeon: Celso College, MD;  Location: [facility] INVASIVE CV LAB;  Service: Cardiovascular;  Laterality: N/A;   SALPINGECTOMY Left 05/15/2001   LAPROSCOPIC   SEPTOSTOMY  08/17/2006   SHOULDER ARTHROSCOPY WITH OPEN ROTATOR CUFF REPAIR Right 03/18/2022   Procedure: Right shoulder arthroscopic rotator cuff repair, subacromial decompression, distal clavicle excision, and biceps tenodesis;  Surgeon: Lorri Rota, MD;  Location: [facility];  Service: Orthopedics;  Laterality: Right;   TONSILLECTOMY  09/27/1991    Medications Prior to Admission  Medication Sig Dispense Refill Last Dose/Taking   albuterol  (VENTOLIN  HFA) 108 (90 Base) MCG/ACT inhaler Inhale into the lungs.   Past Month   ALPRAZolam  (XANAX ) 0.25 MG tablet alprazolam  0.25 mg tablet   10/17/2023   APIXABAN  (ELIQUIS ) VTE STARTER PACK (10MG  AND 5MG ) Take as directed on package: start with two-5mg  tablets twice daily for 7 days. On day 8, switch to one-5mg  tablet twice daily. 74 each 0 10/17/2023   atorvastatin  (LIPITOR) 10 MG tablet Take 1 tablet by mouth daily.   10/18/2023   Barberry-Oreg Grape-Goldenseal (BERBERINE COMPLEX PO) Take 1,200 mg by mouth daily.   Past Week   FARXIGA  10 MG TABS tablet Take 1 tablet by  mouth daily.   10/18/2023   glipiZIDE  (GLIPIZIDE  XL) 10 MG 24 hr tablet Take 2 tablets (20 mg total) by mouth daily with breakfast. 60 tablet 5 10/18/2023   losartan  (COZAAR ) 25 MG tablet Take 1 tablet by mouth daily.   10/17/2023   omeprazole  (PRILOSEC) 40 MG capsule TAKE 1 CAPSULE BY MOUTH 2  TIMES DAILY BEFORE A MEAL (Patient taking differently: 20 mg daily.) 60 capsule 5 10/18/2023   Semaglutide,0.25 or 0.5MG /DOS, (OZEMPIC, 0.25 OR 0.5 MG/DOSE,) 2 MG/1.5ML SOPN Inject into the skin once a week.   10/13/2023   GE100 BLOOD GLUCOSE TEST test strip USE TO TEST THREE TIMES DAILY      ondansetron  (ZOFRAN ) 4 MG tablet Take 1 tablet (4 mg total) by mouth every 8 (eight) hours as needed. (Patient not taking: Reported on 10/18/2023) 20 tablet 0 Not Taking   Social History   Socioeconomic History   Marital status: Married    Spouse name: Not on file   Number of children: Not on file   Years of education: Not on file   Highest education level: Not on file  Occupational History   Not on file  Tobacco Use   Smoking status: Never   Smokeless tobacco: Never  Vaping Use   Vaping status: Never Used  Substance and Sexual Activity   Alcohol use: Yes    Alcohol/week: 0.0 standard drinks of alcohol    Comment: occasionally   Drug use: No   Sexual activity: Not Currently    Partners: Male  Other Topics Concern   Not on file  Social History Narrative   Not on file   Social Drivers of Health   Financial Resource Strain: Low Risk  (03/14/2023)   Received from [facility]   Overall Financial Resource Strain (CARDIA)    Difficulty of Paying Living Expenses: Not hard at all  Food Insecurity: No Food Insecurity (10/18/2023)   Hunger Vital Sign    Worried About Running Out of Food in the Last Year: Never true    Ran Out of Food in the Last Year: Never true  Transportation Needs: No Transportation Needs (10/18/2023)   PRAPARE - Administrator, Civil Service (Medical): No    Lack of Transportation (Non-Medical): No  Physical Activity: Not on file  Stress: Not on file  Social Connections: Unknown (09/30/2023)   Social Connection and Isolation Panel [NHANES]    Frequency of Communication with Friends and Family: Not on file    Frequency of Social Gatherings with  Friends and Family: Not on file    Attends Religious Services: Not on file    Active Member of Clubs or Organizations: Not on file    Attends Banker Meetings: Not on file    Marital Status: Married  Intimate Partner Violence: Not At Risk (10/18/2023)   Humiliation, Afraid, Rape, and Kick questionnaire    Fear of Current or Ex-Partner: No    Emotionally Abused: No    Physically Abused: No    Sexually Abused: No    Family History  Problem Relation Age of Onset   Hyperlipidemia Mother    Hypertension Mother    Diabetes Mother    Hyperlipidemia Father    Hypertension Father    Breast cancer Neg Hx      Vitals:   10/18/23 2107 10/19/23 0015 10/19/23 0457 10/19/23 0745  BP: (!) 158/65 137/60 138/72 132/62  Pulse: 87 78 73 69  Resp: 18 16 16  18  Temp: 98.7 F (37.1 C) 98.2 F (36.8 C) 98.1 F (36.7 C) 97.9 F (36.6 C)  TempSrc: Oral   Oral  SpO2: 93% 96% 97% 96%  Weight: 116.2 kg     Height:        PHYSICAL EXAM General: Well-appearing female, well nourished, in no acute distress. HEENT: Normocephalic and atraumatic. Neck: No JVD.  Lungs: Normal respiratory effort on room air. Clear bilaterally to auscultation. No wheezes, crackles, rhonchi.  Heart: HRRR. Normal S1 and S2 without gallops or murmurs.  Abdomen: Non-distended appearing.  Msk: Normal strength and tone for age. Extremities: Warm and well perfused. No clubbing, cyanosis.  No edema.  Neuro: Alert and oriented X 3. Psych: Answers questions appropriately.   Labs: Basic Metabolic Panel: Recent Labs    10/16/23 1553 10/18/23 1621  NA 137 138  K 3.5 3.8  CL 104 107  CO2 23 21*  GLUCOSE 109* 150*  BUN 18 11  CREATININE 0.68 0.70  CALCIUM  9.1 8.5*   Liver Function Tests: No results for input(s): "AST", "ALT", "ALKPHOS", "BILITOT", "PROT", "ALBUMIN" in the last 72 hours. No results for input(s): "LIPASE", "AMYLASE" in the last 72 hours. CBC: Recent Labs    10/16/23 1553 10/18/23 1621   WBC 9.2 7.1  HGB 14.1 13.5  HCT 43.5 41.4  MCV 80.9 81.3  PLT 250 226   Cardiac Enzymes: Recent Labs    10/16/23 1553 10/16/23 2213  TROPONINIHS 4 4   BNP: No results for input(s): "BNP" in the last 72 hours. D-Dimer: No results for input(s): "DDIMER" in the last 72 hours. Hemoglobin A1C: No results for input(s): "HGBA1C" in the last 72 hours. Fasting Lipid Panel: No results for input(s): "CHOL", "HDL", "LDLCALC", "TRIG", "CHOLHDL", "LDLDIRECT" in the last 72 hours. Thyroid Function Tests: No results for input(s): "TSH", "T4TOTAL", "T3FREE", "THYROIDAB" in the last 72 hours.  Invalid input(s): "FREET3" Anemia Panel: No results for input(s): "VITAMINB12", "FOLATE", "FERRITIN", "TIBC", "IRON", "RETICCTPCT" in the last 72 hours.   Radiology: PERIPHERAL VASCULAR CATHETERIZATION Result Date: 10/18/2023 See surgical note for result.  DG Chest 2 View Result Date: 10/16/2023 CLINICAL DATA:  Chest pain. EXAM: CHEST - 2 VIEW COMPARISON:  September 30, 2023 FINDINGS: The heart size and mediastinal contours are within normal limits. Both lungs are clear. Radiopaque surgical clips are seen within the right upper quadrant. The visualized skeletal structures are unremarkable. IMPRESSION: No active cardiopulmonary disease. Electronically Signed   By: Virgle Grime M.D.   On: 10/16/2023 19:54   CT Angio Chest Pulmonary Embolism (PE) W or WO Contrast Result Date: 10/02/2023 CLINICAL DATA:  Follow-up pulmonary embolus. Exertional shortness of breath. EXAM: CT ANGIOGRAPHY CHEST WITH CONTRAST TECHNIQUE: Multidetector CT imaging of the chest was performed using the standard protocol during bolus administration of intravenous contrast. Multiplanar CT image reconstructions and MIPs were obtained to evaluate the vascular anatomy. RADIATION DOSE REDUCTION: This exam was performed according to the departmental dose-optimization program which includes automated exposure control, adjustment of the mA and/or kV  according to patient size and/or use of iterative reconstruction technique. CONTRAST:  75mL OMNIPAQUE  IOHEXOL  350 MG/ML SOLN COMPARISON:  Chest CT 2 days ago FINDINGS: Cardiovascular: Known pulmonary emboli, slight decrease in thromboembolic burden over the last 2 days. Moderate-large clot burden persists with filling defects involving the distal main, all lobar and many segmental branches. Persistent but decreased right heart strain, RV to LV ratio is 1.13, previously 1.4. There is similar dilatation of the main pulmonary artery. Contrast refluxes into the  hepatic veins and IVC. No pericardial effusion. Mediastinum/Nodes: No enlarged mediastinal or hilar lymph nodes. Unremarkable thyroid gland. Decompressed esophagus. Lungs/Pleura: Small subpleural opacity in the dependent right lower lobe, series 6, image 91, likely a small pulmonary infarct. Additional dependent atelectasis within the lower lobes. No pleural effusion. Breathing motion artifact limits detailed assessment Upper Abdomen: Hepatic steatosis. No acute upper abdominal findings. Musculoskeletal: There are no acute or suspicious osseous abnormalities. Review of the MIP images confirms the above findings. IMPRESSION: 1. Known pulmonary emboli, slight decrease in thromboembolic burden over the last 2 days. Moderate-large clot burden persists with filling defects involving the distal main, all lobar and numerous segmental branches. 2. Persistent but decreased right heart strain, RV to LV ratio is 1.13, previously 1.4. 3. Small subpleural opacity in the dependent right lower lobe, likely a small pulmonary infarct. 4. Hepatic steatosis. Electronically Signed   By: Chadwick Colonel M.D.   On: 10/02/2023 15:20   ECHOCARDIOGRAM COMPLETE Result Date: 10/01/2023    ECHOCARDIOGRAM REPORT   Patient Name:   Jessica Tran [facility] Date of Exam: 10/01/2023 Medical Rec #:  [id number]         Height:       67.0 in Accession #:    [id number]        Weight:       263.9 lb Date of  Birth:  Mar 07, 1966          BSA:          2.275 m Patient Age:    57 years          BP:           141/73 mmHg Patient Gender: F                 HR:           84 bpm. Exam Location:  [facility] Procedure: 2D Echo, Cardiac Doppler and Color Doppler (Both Spectral and Color            Flow Doppler were utilized during procedure). Indications:     Pulmonary Embolus I26.09  History:         Patient has no prior history of Echocardiogram examinations.  Sonographer:     Jane Meager RDCS Referring Phys:  [id number] AMY N COX Diagnosing Phys: Percival Brace MD  Sonographer Comments: Suboptimal subcostal window. Image acquisition challenging due to patient body habitus and Image acquisition challenging due to respiratory motion. IMPRESSIONS  1. Left ventricular ejection fraction, by estimation, is 55 to 60%. The left ventricle has normal function. The left ventricle has no regional wall motion abnormalities. Left ventricular diastolic parameters are consistent with Grade I diastolic dysfunction (impaired relaxation).  2. Right ventricular systolic function is normal. The right ventricular size is normal.  3. The mitral valve is normal in structure. Trivial mitral valve regurgitation. No evidence of mitral stenosis.  4. The aortic valve is normal in structure. Aortic valve regurgitation is not visualized. No aortic stenosis is present.  5. The inferior vena cava is normal in size with greater than 50% respiratory variability, suggesting right atrial pressure of 3 mmHg. FINDINGS  Left Ventricle: Left ventricular ejection fraction, by estimation, is 55 to 60%. The left ventricle has normal function. The left ventricle has no regional wall motion abnormalities. Strain was performed and the global longitudinal strain is indeterminate. The left ventricular internal cavity size was normal in size. There is no left ventricular hypertrophy. Left ventricular diastolic parameters are consistent with Grade I  diastolic dysfunction (impaired  relaxation). Right Ventricle: The right ventricular size is normal. No increase in right ventricular wall thickness. Right ventricular systolic function is normal. Left Atrium: Left atrial size was normal in size. Right Atrium: Right atrial size was normal in size. Pericardium: There is no evidence of pericardial effusion. Mitral Valve: The mitral valve is normal in structure. Trivial mitral valve regurgitation. No evidence of mitral valve stenosis. Tricuspid Valve: The tricuspid valve is normal in structure. Tricuspid valve regurgitation is trivial. No evidence of tricuspid stenosis. Aortic Valve: The aortic valve is normal in structure. Aortic valve regurgitation is not visualized. No aortic stenosis is present. Aortic valve peak gradient measures 9.9 mmHg. Pulmonic Valve: The pulmonic valve was normal in structure. Pulmonic valve regurgitation is not visualized. No evidence of pulmonic stenosis. Aorta: The aortic root is normal in size and structure. Venous: The inferior vena cava is normal in size with greater than 50% respiratory variability, suggesting right atrial pressure of 3 mmHg. IAS/Shunts: No atrial level shunt detected by color flow Doppler. Additional Comments: 3D was performed not requiring image post processing on an independent workstation and was indeterminate.  LEFT VENTRICLE PLAX 2D LVIDd:         3.60 cm   Diastology LVIDs:         2.50 cm   LV e' medial:    9.36 cm/s LV PW:         1.10 cm   LV E/e' medial:  8.1 LV IVS:        1.00 cm   LV e' lateral:   14.50 cm/s LVOT diam:     1.90 cm   LV E/e' lateral: 5.2 LV SV:         52 LV SV Index:   23 LVOT Area:     2.84 cm  RIGHT VENTRICLE RV Basal diam:  3.30 cm RV S prime:     9.90 cm/s TAPSE (M-mode): 1.8 cm LEFT ATRIUM             Index        RIGHT ATRIUM           Index LA diam:        4.00 cm 1.76 cm/m   RA Area:     20.30 cm LA Vol (A2C):   46.4 ml 20.39 ml/m  RA Volume:   60.10 ml  26.42 ml/m LA Vol (A4C):   42.5 ml 18.68 ml/m LA  Biplane Vol: 44.7 ml 19.65 ml/m  AORTIC VALVE                 PULMONIC VALVE AV Area (Vmax): 1.90 cm     PV Vmax:        1.34 m/s AV Vmax:        157.00 cm/s  PV Peak grad:   7.2 mmHg AV Peak Grad:   9.9 mmHg     RVOT Peak grad: 2 mmHg LVOT Vmax:      105.00 cm/s LVOT Vmean:     67.300 cm/s LVOT VTI:       0.184 m  AORTA Ao Root diam: 2.50 cm Ao Asc diam:  2.80 cm MITRAL VALVE               TRICUSPID VALVE MV Area (PHT): 3.68 cm    TR Peak grad:   29.4 mmHg MV Decel Time: 206 msec    TR Vmax:        271.00 cm/s MV E  velocity: 75.50 cm/s MV A velocity: 88.00 cm/s  SHUNTS MV E/A ratio:  0.86        Systemic VTI:  0.18 m                            Systemic Diam: 1.90 cm Percival Brace MD Electronically signed by Percival Brace MD Signature Date/Time: 10/01/2023/12:17:31 PM    Final    US  Venous Img Lower Bilateral (DVT) Result Date: 09/30/2023 CLINICAL DATA:  Pulmonary embolism. EXAM: BILATERAL LOWER EXTREMITY VENOUS DOPPLER ULTRASOUND TECHNIQUE: Gray-scale sonography with graded compression, as well as color Doppler and duplex ultrasound were performed to evaluate the lower extremity deep venous systems from the level of the common femoral vein and including the common femoral, femoral, profunda femoral, popliteal and calf veins including the posterior tibial, peroneal and gastrocnemius veins when visible. The superficial great saphenous vein was also interrogated. Spectral Doppler was utilized to evaluate flow at rest and with distal augmentation maneuvers in the common femoral, femoral and popliteal veins. COMPARISON:  None Available. FINDINGS: RIGHT LOWER EXTREMITY Common Femoral Vein: No evidence of thrombus. Normal compressibility, respiratory phasicity and response to augmentation. Saphenofemoral Junction: No evidence of thrombus. Normal compressibility and flow on color Doppler imaging. Profunda Femoral Vein: No evidence of thrombus. Normal compressibility and flow on color Doppler imaging. Femoral  Vein: No evidence of thrombus. Normal compressibility, respiratory phasicity and response to augmentation. Popliteal Vein: Nonocclusive thrombus. Calf Veins: No evidence of thrombus. Normal compressibility and flow on color Doppler imaging. Superficial Great Saphenous Vein: No evidence of thrombus. Normal compressibility. Other Findings: Small amount of fluid in the popliteal fossa typical of Baker cyst. LEFT LOWER EXTREMITY Common Femoral Vein: No evidence of thrombus. Normal compressibility, respiratory phasicity and response to augmentation. Saphenofemoral Junction: No evidence of thrombus. Normal compressibility and flow on color Doppler imaging. Profunda Femoral Vein: No evidence of thrombus. Normal compressibility and flow on color Doppler imaging. Femoral Vein: No evidence of thrombus. Normal compressibility, respiratory phasicity and response to augmentation. Popliteal Vein: No evidence of thrombus. Normal compressibility, respiratory phasicity and response to augmentation. Calf Veins: No evidence of thrombus. Normal compressibility and flow on color Doppler imaging. Superficial Great Saphenous Vein: No evidence of thrombus. Normal compressibility. IMPRESSION: 1. Nonocclusive thrombus within the right popliteal vein. 2. No evidence of left lower extremity DVT. Electronically Signed   By: Chadwick Colonel M.D.   On: 09/30/2023 18:49   CT Angio Chest PE W and/or Wo Contrast Result Date: 09/30/2023 CLINICAL DATA:  Shortness of breath. Concern for pulmonary embolism. EXAM: CT ANGIOGRAPHY CHEST WITH CONTRAST TECHNIQUE: Multidetector CT imaging of the chest was performed using the standard protocol during bolus administration of intravenous contrast. Multiplanar CT image reconstructions and MIPs were obtained to evaluate the vascular anatomy. RADIATION DOSE REDUCTION: This exam was performed according to the departmental dose-optimization program which includes automated exposure control, adjustment of the mA  and/or kV according to patient size and/or use of iterative reconstruction technique. CONTRAST:  75mL OMNIPAQUE  IOHEXOL  350 MG/ML SOLN COMPARISON:  Chest radiograph dated 09/30/2023 and CT dated 08/12/2021. FINDINGS: Cardiovascular: There is no cardiomegaly or pericardial effusion. There is dilatation of the right heart chambers with RV/LV ratio of approximately 1.4 indicative of right heart straining. The thoracic aorta is unremarkable. Bilateral pulmonary artery emboli involving the lobar and segmental branches of the lower lobes and upper lobes. Mediastinum/Nodes: No hilar or mediastinal adenopathy. The esophagus is grossly unremarkable. No mediastinal fluid  collection. Lungs/Pleura: No focal consolidation, pleural effusion, pneumothorax. The central airways are patent. Upper Abdomen: Fatty liver.  Cholecystectomy. Musculoskeletal: No acute osseous pathology. Review of the MIP images confirms the above findings. IMPRESSION: Positive for acute PE with CT evidence of right heart strain (RV/LV Ratio = 1.4) consistent with at least submassive (intermediate risk) PE. The presence of right heart strain has been associated with an increased risk of morbidity and mortality. Please refer to the "Code PE Focused" order set in EPIC. These results were called by telephone at the time of interpretation on 09/30/2023 at 3:12 pm to Dr. Reinhold Carbine, who verbally acknowledged these results. Electronically Signed   By: Angus Bark M.D.   On: 09/30/2023 15:34   DG Chest 2 View Result Date: 09/30/2023 CLINICAL DATA:  Shortness of breath EXAM: CHEST - 2 VIEW COMPARISON:  06/26/2021 x-ray.  CT 08/12/2021 FINDINGS: The heart size and mediastinal contours are within normal limits. No consolidation, pneumothorax or effusion. No edema. The visualized skeletal structures are unremarkable. IMPRESSION: No acute cardiopulmonary disease. Electronically Signed   By: Adrianna Horde M.D.   On: 09/30/2023 12:02    ECHO 10/01/2023: 1. Left ventricular  ejection fraction, by estimation, is 55 to 60%. The left ventricle has normal function. The left ventricle has no regional wall motion abnormalities. Left ventricular diastolic parameters are consistent with Grade I diastolic dysfunction (impaired relaxation).   2. Right ventricular systolic function is normal. The right ventricular  size is normal.   3. The mitral valve is normal in structure. Trivial mitral valve  regurgitation. No evidence of mitral stenosis.   4. The aortic valve is normal in structure. Aortic valve regurgitation is  not visualized. No aortic stenosis is present.   5. The inferior vena cava is normal in size with greater than 50%  respiratory variability, suggesting right atrial pressure of 3 mmHg.   TELEMETRY reviewed by me 10/19/2023: Sinus rhythm rate 90s  EKG reviewed by me: Normal sinus rhythm rate 96 bpm, prolonged QT interval  Data reviewed by me 10/19/2023: last 24h vitals tele labs imaging I/O vascular surgery notes  Principal Problem:   Pulmonary embolism (HCC)    ASSESSMENT AND PLAN:  OSIRIS CHARLES is a 58 y.o. female  with a past medical history of hyperlipidemia, type II diabetes, palpitations who was recently diagnosed with PE but declined thrombectomy. Due to continue symptoms she underwent outpatient thrombectomy on 10/18/2023 and during this procedure developed atrial fibrillation RVR. Cardiology was consulted for further evaluation.   # Atrial fibrillation RVR # Bilateral pulmonary emboli with right heart strain # Hypertension # Hyperlipidemia Patient diagnosed with bilateral PEs earlier this month and initially declined mechanical thrombectomy however given persistent symptoms decided to proceed with this 10/18/23.  During procedure she developed atrial fibrillation RVR which did not respond to IV diltiazem  and was ultimately started on IV amiodarone .  Now back in sinus rhythm. EKG with NSR, prolonged QT. -Suspect episode of atrial fibrillation  secondary to bilateral PEs with right heart strain. -Patient will get echo in our clinic tomorrow for repeat evaluation. -Increase metoprolol  tartrate to 50 mg daily.   -Continue Eliquis  5 mg twice daily. -Continue atorvastatin  10 mg daily, losartan  25 mg daily.  Stable for discharge home today from cardiac perspective.  She is scheduled for outpatient echocardiogram tomorrow in our clinic for repeat evaluation.  Follow-up scheduled on 10/24/2023 with Shereen Dike, PA to discuss results.  This patient's plan of care was discussed and created with Dr.  Callwood and he is in agreement.  Signed: Hamp Levine, PA-C  10/19/2023, 9:42 AM [facility]

## 2023-10-19 NOTE — Discharge Summary (Signed)
 [facility]    Discharge Summary    Patient ID:  Jessica Tran MRN: [id number] DOB/AGE: Jun 01, 1966 58 y.o.  Admit date: 10/18/2023 Discharge date: 10/19/2023 Date of Surgery: 10/18/2023 Surgeon: Surgeon(s): Dew, Donald Frost, MD  Admission Diagnosis: Acute pulmonary embolism with acute cor pulmonale, unspecified pulmonary embolism type (HCC) [I26.09] Pulmonary embolism (HCC) [I26.99]  Discharge Diagnoses:  Acute pulmonary embolism with acute cor pulmonale, unspecified pulmonary embolism type (HCC) [I26.09] Pulmonary embolism (HCC) [I26.99]  Secondary Diagnoses: Past Medical History:  Diagnosis Date   Anxiety    Arthritis    Bilateral lower extremity edema    Cancer (HCC) 2019   melanoma   Cardiac murmur    Diabetes mellitus without complication (HCC)    Pt takes Metformin  and glipizide    Diverticulitis of colon    GERD (gastroesophageal reflux disease)    Hepatic steatosis 12/22/2015   Morbid obesity (HCC) 03/17/2015   Obesity, Class II, BMI 35.0-39.9, with comorbidity (see actual BMI)     Procedure(s): PULMONARY ANGIOGRAPHY  Discharged Condition: good  HPI:  Jessica Tran is a 58 year old female who presented to [facility] vein and vascular lab for pulmonary thrombectomy yesterday on 10/18/2023.  During the procedure she went into atrial fibrillation with RVR.  Patient was then admitted overnight and consult to cardiology was placed.  Patient has a history of right lower extremity DVT with pulmonary embolism from 09/30/2023.  She was on Eliquis  post discharge after not undergoing pulmonary thrombectomy on previous admission.  Patient was on a heparin  infusion and that has been discontinued today.  Converted back over to her home Eliquis  at 5 mg twice daily.  Patient was seen by cardiology and recommended to start metoprolol  50 mg once daily.  Continue her Cozaar  at 25 mg daily.  Continue Eliquis  5 mg twice daily and continue atorvastatin  10 mg  daily.  Upon exam today patient is resting comfortably in bed.  She is not requiring any supplemental oxygen.  She states when she ambulates she is a lot less short of breath.  She feels good.  Patient would like to be discharged today.  Patient to be discharged on Eliquis  5 mg twice daily and atorvastatin  10 mg once daily.  Patient was counseled not to miss or skip any his medications as it will affect the outcome of her procedure.  Patient verbalized her understanding.  Hospital Course:  Jessica Tran is a 58 y.o. female is S/P Bilateral Extubated: POD # 0 Physical Exam:  Alert notes x3, no acute distress Face: Symmetrical.  Tongue is midline. Neck: Trachea is midline.  No swelling or bruising. Cardiovascular: Regular rate and rhythm Pulmonary: Clear to auscultation bilaterally Abdomen: Soft, nontender, nondistended Right groin access: Clean dry and intact.  No swelling or drainage noted Left lower extremity: Thigh soft.  Calf soft.  Extremities warm distally toes.  Hard to palpate pedal pulses however the foot is warm is her good capillary refill. Right lower extremity: Thigh soft.  Calf soft.  Extremities warm distally toes.  Hard to palpate pedal pulses however the foot is warm is her good capillary refill. Neurological: No deficits noted   Post-op wounds:  clean, dry, intact or healing well  Pt. Ambulating, voiding and taking PO diet without difficulty. Pt pain controlled with PO pain meds.  Labs:  As below  Complications: none  Consults:    Significant Diagnostic Studies: CBC Lab Results  Component Value Date   WBC 7.1 10/18/2023   HGB 13.5  10/18/2023   HCT 41.4 10/18/2023   MCV 81.3 10/18/2023   PLT 226 10/18/2023    BMET    Component Value Date/Time   NA 138 10/18/2023 1621   NA 138 03/26/2015 0804   NA 136 07/28/2014 0952   K 3.8 10/18/2023 1621   K 3.0 (L) 07/28/2014 0952   CL 107 10/18/2023 1621   CL 102 07/28/2014 0952   CO2 21 (L) 10/18/2023  1621   CO2 24 07/28/2014 0952   GLUCOSE 150 (H) 10/18/2023 1621   GLUCOSE 205 (H) 07/28/2014 0952   BUN 11 10/18/2023 1621   BUN 10 03/26/2015 0804   BUN 11 07/28/2014 0952   CREATININE 0.70 10/18/2023 1621   CREATININE 0.65 12/14/2015 1246   CALCIUM  8.5 (L) 10/18/2023 1621   CALCIUM  9.3 07/28/2014 0952   GFRNONAA >60 10/18/2023 1621   GFRNONAA >89 12/14/2015 1246   GFRAA >89 12/14/2015 1246   COAG Lab Results  Component Value Date   INR 1.1 09/30/2023   INR 0.8 06/25/2012     Disposition:  Discharge to :Home  Allergies as of 10/19/2023       Reactions   Clindamycin/lincomycin Swelling   Throat swelling   Amoxicillin-pot Clavulanate Diarrhea   Cefuroxime Axetil Diarrhea   Pravastatin Diarrhea        Medication List     TAKE these medications    albuterol  108 (90 Base) MCG/ACT inhaler Commonly known as: VENTOLIN  HFA Inhale into the lungs.   ALPRAZolam  0.25 MG tablet Commonly known as: XANAX  alprazolam  0.25 mg tablet   atorvastatin  10 MG tablet Commonly known as: LIPITOR Take 1 tablet by mouth daily.   BERBERINE COMPLEX PO Take 1,200 mg by mouth daily.   Eliquis  DVT/PE Starter Pack Generic drug: Apixaban  Starter Pack (10mg  and 5mg ) Take as directed on package: start with two-5mg  tablets twice daily for 7 days. On day 8, switch to one-5mg  tablet twice daily. What changed: Another medication with the same name was added. Make sure you understand how and when to take each.   apixaban  5 MG Tabs tablet Commonly known as: ELIQUIS  Take 1 tablet (5 mg total) by mouth 2 (two) times daily. What changed: You were already taking a medication with the same name, and this prescription was added. Make sure you understand how and when to take each.   Farxiga  10 MG Tabs tablet Generic drug: dapagliflozin  propanediol Take 1 tablet by mouth daily.   GE100 Blood Glucose Test test strip Generic drug: glucose blood USE TO TEST THREE TIMES DAILY   glipiZIDE  10 MG 24  hr tablet Commonly known as: glipiZIDE  XL Take 2 tablets (20 mg total) by mouth daily with breakfast.   losartan  25 MG tablet Commonly known as: COZAAR  Take 1 tablet by mouth daily.   metoprolol  tartrate 50 MG tablet Commonly known as: LOPRESSOR  Take 1 tablet (50 mg total) by mouth daily.   omeprazole  40 MG capsule Commonly known as: PRILOSEC TAKE 1 CAPSULE BY MOUTH 2 TIMES DAILY BEFORE A MEAL What changed: See the new instructions.   ondansetron  4 MG tablet Commonly known as: Zofran  Take 1 tablet (4 mg total) by mouth every 8 (eight) hours as needed.   Ozempic (0.25 or 0.5 MG/DOSE) 2 MG/1.5ML Sopn Generic drug: Semaglutide(0.25 or 0.5MG /DOS) Inject into the skin once a week.       Verbal and written Discharge instructions given to the patient. Wound care per Discharge AVS  Follow-up Information     Shereen Dike  M, PA-C Follow up.   Specialty: Cardiology Why: Echo scheduled for 10/20/2023 at 2 PM at [facility] clinic cardiology.  Follow-up scheduled with Shereen Dike, PA at [facility] clinic cardiology on/20 02/2024 at 2 PM Contact information: [address] [place] [address] [phone]         Celso College, MD Follow up in 8 week(s).   Specialties: Vascular Surgery, Radiology, Interventional Cardiology Why: Right Lower Extremity Ultrasound Hx DVT Contact information: [address] [place] [address] [phone]                 Signed: Annamaria Barrette, NP  10/19/2023, 12:36 PM

## 2023-10-19 NOTE — Discharge Instructions (Signed)
 No heavy lifting for 1 week.  Do not lift anything more than a gallon of milk.  No driving for 1 week.  You may shower tomorrow after you get home.  Shower with the dressing to your groin in place and remove immediately after surgery.  Pat completely dry and cover with a Band-Aid for the next 3 days.  You are discharged on Eliquis  5 mg twice daily and atorvastatin  10 mg daily.  Continue to take these medications.  Do not skip taking your blood thinner as it may affect the outcome of your procedure.  Follow-up with vein and vascular surgery as scheduled.

## 2023-10-20 ENCOUNTER — Telehealth (INDEPENDENT_AMBULATORY_CARE_PROVIDER_SITE_OTHER): Payer: Self-pay

## 2023-10-20 NOTE — Telephone Encounter (Signed)
 Sometimes you can have a cough because the lungs are somewhat irritated from the clot, it should subside but she can use cough drops.  She can drive as long as it is a short distance

## 2023-10-20 NOTE — Telephone Encounter (Signed)
 LVM for pt letting her know that she can drive short distances and the cough is normal to use cough drops and if she has any further questions she can call out office.

## 2023-10-24 ENCOUNTER — Telehealth (INDEPENDENT_AMBULATORY_CARE_PROVIDER_SITE_OTHER): Payer: Self-pay | Admitting: Nurse Practitioner

## 2023-10-24 NOTE — Telephone Encounter (Signed)
 Pt came in and dropped off paper work for fmla that needs to be filled out. Papers placed in nurses box.

## 2023-10-27 NOTE — Telephone Encounter (Signed)
 Patient called asking about FMLA paperwork. States she needs it filled out asap if possible. It's just one page.

## 2023-10-30 NOTE — Telephone Encounter (Signed)
It is on my desk 

## 2023-10-30 NOTE — Telephone Encounter (Signed)
 Note copied and given to [place]

## 2023-11-14 ENCOUNTER — Encounter (INDEPENDENT_AMBULATORY_CARE_PROVIDER_SITE_OTHER): Payer: Self-pay

## 2023-11-16 ENCOUNTER — Other Ambulatory Visit: Payer: Self-pay | Admitting: Internal Medicine

## 2023-11-16 DIAGNOSIS — Z1231 Encounter for screening mammogram for malignant neoplasm of breast: Secondary | ICD-10-CM

## 2023-11-28 ENCOUNTER — Other Ambulatory Visit (INDEPENDENT_AMBULATORY_CARE_PROVIDER_SITE_OTHER): Payer: Self-pay | Admitting: Vascular Surgery

## 2023-11-28 DIAGNOSIS — I82431 Acute embolism and thrombosis of right popliteal vein: Secondary | ICD-10-CM

## 2023-12-01 ENCOUNTER — Ambulatory Visit (INDEPENDENT_AMBULATORY_CARE_PROVIDER_SITE_OTHER): Payer: Self-pay | Admitting: Vascular Surgery

## 2023-12-01 ENCOUNTER — Encounter (INDEPENDENT_AMBULATORY_CARE_PROVIDER_SITE_OTHER): Payer: Self-pay | Admitting: Vascular Surgery

## 2023-12-01 ENCOUNTER — Ambulatory Visit (INDEPENDENT_AMBULATORY_CARE_PROVIDER_SITE_OTHER): Payer: Self-pay

## 2023-12-01 VITALS — BP 139/81 | HR 91 | Resp 18 | Ht 67.0 in | Wt 260.4 lb

## 2023-12-01 DIAGNOSIS — I2699 Other pulmonary embolism without acute cor pulmonale: Secondary | ICD-10-CM | POA: Diagnosis not present

## 2023-12-01 DIAGNOSIS — I82431 Acute embolism and thrombosis of right popliteal vein: Secondary | ICD-10-CM

## 2023-12-01 NOTE — Progress Notes (Signed)
 Subjective:    Patient ID: Jessica Tran, female    DOB: [birth year], 57 y.o.   MRN: [id number] Chief Complaint  Patient presents with   Follow-up    3 month follow up Bilateral Thrombectomy     Jessica Tran is a 59 yo female who presents to clinic today for 6-week follow-up after pulmonary embolisms, right lower extremity DVT and pulmonary thrombectomy.  Patient endorses she feels well today.  She denies having any difficulties with either walking or breathing today.  She states that her breathing is back to normal.  She does endorse that some days she can feel fatigued but otherwise this is manageable.  She denies any pain or swelling to her right lower extremity.  Patient endorses she is compliant with taking her Eliquis  5 mg twice daily.    Review of Systems  Constitutional: Negative.   Respiratory: Negative.    Cardiovascular: Negative.   Gastrointestinal: Negative.   Psychiatric/Behavioral: Negative.    All other systems reviewed and are negative.      Objective:    Physical Exam Constitutional:      Appearance: Normal appearance. She is obese.  HENT:     Head: Normocephalic.  Eyes:     Pupils: Pupils are equal, round, and reactive to light.  Cardiovascular:     Rate and Rhythm: Normal rate and regular rhythm.     Pulses: Normal pulses.     Heart sounds: Normal heart sounds.  Pulmonary:     Effort: Pulmonary effort is normal.     Breath sounds: Normal breath sounds.  Abdominal:     General: Abdomen is flat. Bowel sounds are normal.     Palpations: Abdomen is soft.  Musculoskeletal:        General: Normal range of motion.     Cervical back: Normal range of motion.  Skin:    General: Skin is warm and dry.     Capillary Refill: Capillary refill takes 2 to 3 seconds.  Neurological:     General: No focal deficit present.     Mental Status: She is alert and oriented to person, place, and time. Mental status is at baseline.  Psychiatric:        Mood and  Affect: Mood normal.        Behavior: Behavior normal.        Thought Content: Thought content normal.        Judgment: Judgment normal.     BP 139/81 (BP Location: Left Arm, Patient Position: Sitting, Cuff Size: Large)   Pulse 91   Resp 18   Ht 5\' 7"  (1.702 m)   Wt 260 lb 6.4 oz (118.1 kg)   BMI 40.78 kg/m   Past Medical History:  Diagnosis Date   Anxiety    Arthritis    Bilateral lower extremity edema    Cancer (HCC) 2019   melanoma   Cardiac murmur    Diabetes mellitus without complication (HCC)    Pt takes Metformin  and glipizide    Diverticulitis of colon    GERD (gastroesophageal reflux disease)    Hepatic steatosis 12/22/2015   Morbid obesity (HCC) 03/17/2015   Obesity, Class II, BMI 35.0-39.9, with comorbidity (see actual BMI)     Social History   Socioeconomic History   Marital status: Married    Spouse name: Not on file   Number of children: Not on file   Years of education: Not on file   Highest education level: Not on file  Occupational History   Not on file  Tobacco Use   Smoking status: Never   Smokeless tobacco: Never  Vaping Use   Vaping status: Never Used  Substance and Sexual Activity   Alcohol use: Yes    Alcohol/week: 0.0 standard drinks of alcohol    Comment: occasionally   Drug use: No   Sexual activity: Not Currently    Partners: Male  Other Topics Concern   Not on file  Social History Narrative   Not on file   Social Drivers of Health   Financial Resource Strain: Low Risk  (11/15/2023)   Received from [facility]   Overall Financial Resource Strain (CARDIA)    Difficulty of Paying Living Expenses: Not very hard  Food Insecurity: No Food Insecurity (11/15/2023)   Received from [facility]   Hunger Vital Sign    Worried About Running Out of Food in the Last Year: Never true    Ran Out of Food in the Last Year: Never true  Transportation Needs: No Transportation Needs (11/15/2023)   Received  from [facility]    In the past 12 months, has lack of transportation kept you from medical appointments or from getting medications?: No    Lack of Transportation (Non-Medical): No  Physical Activity: Not on file  Stress: Not on file  Social Connections: Unknown (09/30/2023)   Social Connection and Isolation Panel [NHANES]    Frequency of Communication with Friends and Family: Not on file    Frequency of Social Gatherings with Friends and Family: Not on file    Attends Religious Services: Not on file    Active Member of Clubs or Organizations: Not on file    Attends Banker Meetings: Not on file    Marital Status: Married  Intimate Partner Violence: Not At Risk (10/18/2023)   Humiliation, Afraid, Rape, and Kick questionnaire    Fear of Current or Ex-Partner: No    Emotionally Abused: No    Physically Abused: No    Sexually Abused: No    Past Surgical History:  Procedure Laterality Date   ABDOMINAL HYSTERECTOMY  12/18/2001   LAPROSCOPIC SUPRACERVICAL WITH LYSES OF ADHESIONS   ARTHROSCOPIC REPAIR ACL Right 07/10/2012   MCL TEAR 1995   BARTHOLIN GLAND CYST EXCISION  1995   CHOLECYSTECTOMY  01/01/2004   LAPROSCOPIC   COLONOSCOPY WITH PROPOFOL  N/A 06/24/2016   Procedure: COLONOSCOPY WITH PROPOFOL ;  Surgeon: Cassie Click, MD;  Location[facility] ENDOSCOPY;  Service: Endoscopy;  Laterality: N/A;   ESOPHAGOGASTRODUODENOSCOPY (EGD) WITH PROPOFOL  N/A 10/21/2020   Procedure: ESOPHAGOGASTRODUODENOSCOPY (EGD) WITH PROPOFOL ;  Surgeon: Selena Daily, MD;  Location: [facility] ENDOSCOPY;  Service: Gastroenterology;  Laterality: N/A;   LAPAROSCOPIC OOPHERECTOMY Left 05/15/2001   LYSIS OF ADHESION  05/15/2001   LAPARSCOPIC   PULMONARY ANGIOGRAPHY N/A 10/18/2023   Procedure: PULMONARY ANGIOGRAPHY;  Surgeon: Celso College, MD;  Location: [facility] INVASIVE CV LAB;  Service: Cardiovascular;  Laterality: N/A;   SALPINGECTOMY Left 05/15/2001   LAPROSCOPIC    SEPTOSTOMY  08/17/2006   SHOULDER ARTHROSCOPY WITH OPEN ROTATOR CUFF REPAIR Right 03/18/2022   Procedure: Right shoulder arthroscopic rotator cuff repair, subacromial decompression, distal clavicle excision, and biceps tenodesis;  Surgeon: Lorri Rota, MD;  Location: [facility];  Service: Orthopedics;  Laterality: Right;   TONSILLECTOMY  09/27/1991    Family History  Problem Relation Age of Onset   Hyperlipidemia Mother    Hypertension Mother  Diabetes Mother    Hyperlipidemia Father    Hypertension Father    Breast cancer Neg Hx     Allergies  Allergen Reactions   Clindamycin/Lincomycin Swelling    Throat swelling   Amoxicillin-Pot Clavulanate Diarrhea   Cefuroxime Axetil Diarrhea   Pravastatin Diarrhea       Latest Ref Rng & Units 10/18/2023    4:21 PM 10/16/2023    3:53 PM 10/03/2023    5:17 AM  CBC  WBC 4.0 - 10.5 K/uL 7.1  9.2  7.0   Hemoglobin 12.0 - 15.0 g/dL 13.0  86.5  78.4   Hematocrit 36.0 - 46.0 % 41.4  43.5  38.2   Platelets 150 - 400 K/uL 226  250  217        CMP     Component Value Date/Time   NA 138 10/18/2023 1621   NA 138 03/26/2015 0804   NA 136 07/28/2014 0952   K 3.8 10/18/2023 1621   K 3.0 (L) 07/28/2014 0952   CL 107 10/18/2023 1621   CL 102 07/28/2014 0952   CO2 21 (L) 10/18/2023 1621   CO2 24 07/28/2014 0952   GLUCOSE 150 (H) 10/18/2023 1621   GLUCOSE 205 (H) 07/28/2014 0952   BUN 11 10/18/2023 1621   BUN 10 03/26/2015 0804   BUN 11 07/28/2014 0952   CREATININE 0.70 10/18/2023 1621   CREATININE 0.65 12/14/2015 1246   CALCIUM  8.5 (L) 10/18/2023 1621   CALCIUM  9.3 07/28/2014 0952   PROT 7.4 09/30/2023 1121   PROT 6.8 03/26/2015 0804   PROT 7.5 07/28/2014 0952   ALBUMIN 3.6 09/30/2023 1121   ALBUMIN 4.1 03/26/2015 0804   ALBUMIN 3.3 (L) 07/28/2014 0952   AST 32 09/30/2023 1121   AST 67 (H) 07/28/2014 0952   ALT 44 09/30/2023 1121   ALT 76 (H) 07/28/2014 0952   ALKPHOS 84 09/30/2023 1121   ALKPHOS 104 07/28/2014  0952   BILITOT 0.7 09/30/2023 1121   BILITOT 0.3 03/26/2015 0804   BILITOT 0.4 07/28/2014 0952   GFRNONAA >60 10/18/2023 1621   GFRNONAA >89 12/14/2015 1246     No results found.     Assessment & Plan:   1. Bilateral pulmonary embolism (HCC) (Primary) Patient returns to clinic today for 6 weeks follow-up from bilateral pulmonary embolisms with pulmonary thrombectomy.  Patient's breathing normally today and nonlabored.  Patient endorses she is breathing much better from the original incident.  She does have days where she is fatigued but otherwise says that this is very manageable.  She endorses she takes her Eliquis  twice daily.  She denies any chest pain, shortness of breath or any abdominal pain today.  Patient also endorses she is being treated for new onset A-fib that developed during the pulmonary embolisms.  She has been on medication and has cut that medication in half as she has not had any recurrent atrial fibrillation that she knows of.  She is following up with cardiology as requested.  2. Deep vein thrombosis (DVT) of popliteal vein of right lower extremity, unspecified chronicity (HCC) Patient returns to clinic today 6 weeks postop pulmonary thrombectomy with a right lower extremity DVT.  Patient underwent vascular venous lower extremity ultrasound for DVT.  There is no evidence to suggest that she has a deep vein thrombosis any longer.  She denies having any difficulties ambulating or sitting.  She denies any pain to her right lower extremity.  She denies any difficulties walking or changes to her lifestyle.  She denies any swelling to her lower extremity today.  Patient will follow-up in 3 months with venous ultrasound study to check for DVTs.   Current Outpatient Medications on File Prior to Visit  Medication Sig Dispense Refill   albuterol  (VENTOLIN  HFA) 108 (90 Base) MCG/ACT inhaler Inhale into the lungs.     ALPRAZolam  (XANAX ) 0.25 MG tablet alprazolam  0.25 mg tablet      apixaban  (ELIQUIS ) 5 MG TABS tablet Take 1 tablet (5 mg total) by mouth 2 (two) times daily. 60 tablet 6   atorvastatin  (LIPITOR) 10 MG tablet Take 1 tablet by mouth daily.     Barberry-Oreg Grape-Goldenseal (BERBERINE COMPLEX PO) Take 1,200 mg by mouth daily.     FARXIGA  10 MG TABS tablet Take 1 tablet by mouth daily.     GE100 BLOOD GLUCOSE TEST test strip USE TO TEST THREE TIMES DAILY     glipiZIDE  (GLIPIZIDE  XL) 10 MG 24 hr tablet Take 2 tablets (20 mg total) by mouth daily with breakfast. 60 tablet 5   losartan  (COZAAR ) 25 MG tablet Take 1 tablet by mouth daily.     metoprolol  tartrate (LOPRESSOR ) 25 MG tablet Take 25 mg by mouth once.     omeprazole  (PRILOSEC) 40 MG capsule TAKE 1 CAPSULE BY MOUTH 2 TIMES DAILY BEFORE A MEAL (Patient taking differently: 20 mg daily.) 60 capsule 5   ondansetron  (ZOFRAN ) 4 MG tablet Take 1 tablet (4 mg total) by mouth every 8 (eight) hours as needed. 20 tablet 0   Semaglutide,0.25 or 0.5MG /DOS, (OZEMPIC, 0.25 OR 0.5 MG/DOSE,) 2 MG/1.5ML SOPN Inject into the skin once a week.     APIXABAN  (ELIQUIS ) VTE STARTER PACK (10MG  AND 5MG ) Take as directed on package: start with two-5mg  tablets twice daily for 7 days. On day 8, switch to one-5mg  tablet twice daily. (Patient not taking: Reported on 12/01/2023) 74 each 0   metoprolol  tartrate (LOPRESSOR ) 50 MG tablet Take 1 tablet (50 mg total) by mouth daily. 30 tablet 6   No current facility-administered medications on file prior to visit.    There are no Patient Instructions on file for this visit. No follow-ups on file.   Annamaria Barrette, NP

## 2023-12-04 ENCOUNTER — Ambulatory Visit
Admission: RE | Admit: 2023-12-04 | Discharge: 2023-12-04 | Disposition: A | Discharge status: Home / Self Care | Source: Ambulatory Visit | Attending: Internal Medicine | Admitting: Internal Medicine

## 2023-12-04 DIAGNOSIS — Z1231 Encounter for screening mammogram for malignant neoplasm of breast: Secondary | ICD-10-CM | POA: Diagnosis not present

## 2023-12-05 ENCOUNTER — Ambulatory Visit (INDEPENDENT_AMBULATORY_CARE_PROVIDER_SITE_OTHER): Payer: Self-pay | Admitting: Nurse Practitioner

## 2023-12-05 ENCOUNTER — Encounter (INDEPENDENT_AMBULATORY_CARE_PROVIDER_SITE_OTHER): Payer: Self-pay

## 2023-12-22 ENCOUNTER — Other Ambulatory Visit: Payer: Self-pay | Admitting: Internal Medicine

## 2023-12-22 DIAGNOSIS — R1084 Generalized abdominal pain: Secondary | ICD-10-CM

## 2023-12-25 ENCOUNTER — Ambulatory Visit
Admission: RE | Admit: 2023-12-25 | Discharge: 2023-12-25 | Disposition: A | Discharge status: Home / Self Care | Source: Ambulatory Visit | Attending: Internal Medicine | Admitting: Internal Medicine

## 2023-12-25 DIAGNOSIS — R1084 Generalized abdominal pain: Secondary | ICD-10-CM

## 2023-12-25 MED ORDER — IOPAMIDOL (ISOVUE-300) INJECTION 61%
100.0000 mL | Freq: Once | INTRAVENOUS | Status: AC | PRN
  Administered 2023-12-25: 100 mL via INTRAVENOUS

## 2024-02-29 ENCOUNTER — Other Ambulatory Visit (INDEPENDENT_AMBULATORY_CARE_PROVIDER_SITE_OTHER): Payer: Self-pay | Admitting: Vascular Surgery

## 2024-02-29 DIAGNOSIS — I82431 Acute embolism and thrombosis of right popliteal vein: Secondary | ICD-10-CM

## 2024-03-01 ENCOUNTER — Ambulatory Visit (INDEPENDENT_AMBULATORY_CARE_PROVIDER_SITE_OTHER)

## 2024-03-01 ENCOUNTER — Ambulatory Visit (INDEPENDENT_AMBULATORY_CARE_PROVIDER_SITE_OTHER): Admitting: Vascular Surgery

## 2024-03-01 ENCOUNTER — Encounter (INDEPENDENT_AMBULATORY_CARE_PROVIDER_SITE_OTHER): Payer: Self-pay | Admitting: Vascular Surgery

## 2024-03-01 VITALS — BP 139/81 | HR 71 | Ht 67.0 in | Wt 265.0 lb

## 2024-03-01 DIAGNOSIS — I82431 Acute embolism and thrombosis of right popliteal vein: Secondary | ICD-10-CM

## 2024-03-01 DIAGNOSIS — I2699 Other pulmonary embolism without acute cor pulmonale: Secondary | ICD-10-CM | POA: Diagnosis not present

## 2024-03-01 NOTE — Progress Notes (Signed)
 Subjective:    Patient ID: Jessica Tran, female    DOB: Aug 07, 1965, 58 y.o.   MRN: [id number] No chief complaint on file.   Adabelle Griffiths is a 58 yo female who presents to clinic today for 3 month follow-up after pulmonary embolisms, right lower extremity DVT and pulmonary thrombectomy.  Patient endorses she feels well today.  She denies having any difficulties with either walking or breathing today.  She states that her breathing is back to normal.  She does endorse that some days she can feel fatigued but otherwise this is manageable.  She denies any pain or swelling to her right lower extremity.  Patient endorses she is compliant with taking her Eliquis  5 mg twice daily.    Review of Systems  Constitutional: Negative.   Respiratory:  Positive for shortness of breath.        Shortness of breath with fatigue very intermittently  All other systems reviewed and are negative.      Objective:   Physical Exam Vitals reviewed.  Constitutional:      Appearance: Normal appearance. She is obese.  HENT:     Head: Normocephalic.  Eyes:     Pupils: Pupils are equal, round, and reactive to light.  Cardiovascular:     Rate and Rhythm: Normal rate. Rhythm irregular.     Pulses: Normal pulses.     Heart sounds: Normal heart sounds.  Pulmonary:     Effort: Pulmonary effort is normal.     Breath sounds: Normal breath sounds.  Abdominal:     General: Bowel sounds are normal.     Palpations: Abdomen is soft.  Musculoskeletal:        General: Normal range of motion.  Skin:    General: Skin is warm and dry.     Capillary Refill: Capillary refill takes 2 to 3 seconds.  Neurological:     General: No focal deficit present.     Mental Status: She is alert and oriented to person, place, and time. Mental status is at baseline.  Psychiatric:        Mood and Affect: Mood normal.        Behavior: Behavior normal.        Thought Content: Thought content normal.        Judgment: Judgment  normal.     There were no vitals taken for this visit.  Past Medical History:  Diagnosis Date   Anxiety    Arthritis    Bilateral lower extremity edema    Cancer (HCC) 2019   melanoma   Cardiac murmur    Diabetes mellitus without complication (HCC)    Pt takes Metformin  and glipizide    Diverticulitis of colon    GERD (gastroesophageal reflux disease)    Hepatic steatosis 12/22/2015   Morbid obesity (HCC) 03/17/2015   Obesity, Class II, BMI 35.0-39.9, with comorbidity (see actual BMI)     Social History   Socioeconomic History   Marital status: Married    Spouse name: Not on file   Number of children: Not on file   Years of education: Not on file   Highest education level: Not on file  Occupational History   Not on file  Tobacco Use   Smoking status: Never   Smokeless tobacco: Never  Vaping Use   Vaping status: Never Used  Substance and Sexual Activity   Alcohol use: Yes    Alcohol/week: 0.0 standard drinks of alcohol    Comment: occasionally  Drug use: No   Sexual activity: Not Currently    Partners: Male  Other Topics Concern   Not on file  Social History Narrative   Not on file   Social Drivers of Health   Financial Resource Strain: Low Risk  (12/21/2023)   Received from [facility]   Overall Financial Resource Strain (CARDIA)    Difficulty of Paying Living Expenses: Not hard at all  Food Insecurity: No Food Insecurity (12/21/2023)   Received from [facility]   Hunger Vital Sign    Within the past 12 months, you worried that your food would run out before you got the money to buy more.: Never true    Within the past 12 months, the food you bought just didn't last and you didn't have money to get more.: Never true  Transportation Needs: No Transportation Needs (12/21/2023)   Received from [facility] - Transportation    In the past 12 months, has lack of transportation kept you from medical  appointments or from getting medications?: No    Lack of Transportation (Non-Medical): No  Physical Activity: Not on file  Stress: Not on file  Social Connections: Unknown (09/30/2023)   Social Connection and Isolation Panel    Frequency of Communication with Friends and Family: Not on file    Frequency of Social Gatherings with Friends and Family: Not on file    Attends Religious Services: Not on file    Active Member of Clubs or Organizations: Not on file    Attends Banker Meetings: Not on file    Marital Status: Married  Intimate Partner Violence: Not At Risk (10/18/2023)   Humiliation, Afraid, Rape, and Kick questionnaire    Fear of Current or Ex-Partner: No    Emotionally Abused: No    Physically Abused: No    Sexually Abused: No    Past Surgical History:  Procedure Laterality Date   ABDOMINAL HYSTERECTOMY  12/18/2001   LAPROSCOPIC SUPRACERVICAL WITH LYSES OF ADHESIONS   ARTHROSCOPIC REPAIR ACL Right 07/10/2012   MCL TEAR 1995   BARTHOLIN GLAND CYST EXCISION  1995   CHOLECYSTECTOMY  01/01/2004   LAPROSCOPIC   COLONOSCOPY WITH PROPOFOL  N/A 06/24/2016   Procedure: COLONOSCOPY WITH PROPOFOL ;  Surgeon: Lamar ONEIDA Holmes, MD;  Location[facility] ENDOSCOPY;  Service: Endoscopy;  Laterality: N/A;   ESOPHAGOGASTRODUODENOSCOPY (EGD) WITH PROPOFOL  N/A 10/21/2020   Procedure: ESOPHAGOGASTRODUODENOSCOPY (EGD) WITH PROPOFOL ;  Surgeon: Unk Corinn Skiff, MD;  Location: [facility] ENDOSCOPY;  Service: Gastroenterology;  Laterality: N/A;   LAPAROSCOPIC OOPHERECTOMY Left 05/15/2001   LYSIS OF ADHESION  05/15/2001   LAPARSCOPIC   PULMONARY ANGIOGRAPHY N/A 10/18/2023   Procedure: PULMONARY ANGIOGRAPHY;  Surgeon: Marea Selinda RAMAN, MD;  Location: [facility] INVASIVE CV LAB;  Service: Cardiovascular;  Laterality: N/A;   SALPINGECTOMY Left 05/15/2001   LAPROSCOPIC   SEPTOSTOMY  08/17/2006   SHOULDER ARTHROSCOPY WITH OPEN ROTATOR CUFF REPAIR Right 03/18/2022   Procedure: Right shoulder arthroscopic rotator  cuff repair, subacromial decompression, distal clavicle excision, and biceps tenodesis;  Surgeon: Tobie Priest, MD;  Location: [facility];  Service: Orthopedics;  Laterality: Right;   TONSILLECTOMY  09/27/1991    Family History  Problem Relation Age of Onset   Hyperlipidemia Mother    Hypertension Mother    Diabetes Mother    Hyperlipidemia Father    Hypertension Father    Breast cancer Neg Hx     Allergies  Allergen Reactions   Clindamycin/Lincomycin  Swelling    Throat swelling   Amoxicillin-Pot Clavulanate Diarrhea   Cefuroxime Axetil Diarrhea   Pravastatin Diarrhea       Latest Ref Rng & Units 10/18/2023    4:21 PM 10/16/2023    3:53 PM 10/03/2023    5:17 AM  CBC  WBC 4.0 - 10.5 K/uL 7.1  9.2  7.0   Hemoglobin 12.0 - 15.0 g/dL 86.4  85.8  86.9   Hematocrit 36.0 - 46.0 % 41.4  43.5  38.2   Platelets 150 - 400 K/uL 226  250  217       CMP     Component Value Date/Time   NA 138 10/18/2023 1621   NA 138 03/26/2015 0804   NA 136 07/28/2014 0952   K 3.8 10/18/2023 1621   K 3.0 (L) 07/28/2014 0952   CL 107 10/18/2023 1621   CL 102 07/28/2014 0952   CO2 21 (L) 10/18/2023 1621   CO2 24 07/28/2014 0952   GLUCOSE 150 (H) 10/18/2023 1621   GLUCOSE 205 (H) 07/28/2014 0952   BUN 11 10/18/2023 1621   BUN 10 03/26/2015 0804   BUN 11 07/28/2014 0952   CREATININE 0.70 10/18/2023 1621   CREATININE 0.65 12/14/2015 1246   CALCIUM  8.5 (L) 10/18/2023 1621   CALCIUM  9.3 07/28/2014 0952   PROT 7.4 09/30/2023 1121   PROT 6.8 03/26/2015 0804   PROT 7.5 07/28/2014 0952   ALBUMIN 3.6 09/30/2023 1121   ALBUMIN 4.1 03/26/2015 0804   ALBUMIN 3.3 (L) 07/28/2014 0952   AST 32 09/30/2023 1121   AST 67 (H) 07/28/2014 0952   ALT 44 09/30/2023 1121   ALT 76 (H) 07/28/2014 0952   ALKPHOS 84 09/30/2023 1121   ALKPHOS 104 07/28/2014 0952   BILITOT 0.7 09/30/2023 1121   BILITOT 0.3 03/26/2015 0804   BILITOT 0.4 07/28/2014 0952   GFRNONAA >60 10/18/2023 1621   GFRNONAA >89  12/14/2015 1246     No results found.     Assessment & Plan:   1. Bilateral pulmonary embolism (HCC) (Primary) Patient returns to clinic today for 3 month follow-up from bilateral pulmonary embolisms with pulmonary thrombectomy.  Patient's breathing normally today and nonlabored.  Patient endorses she is breathing much better from the original incident.  She does have days where she is fatigued but otherwise says that this is very manageable.  She endorses she takes her Eliquis  twice daily.  She denies any chest pain, shortness of breath or any abdominal pain today.   Patient also endorses she is being treated for new onset A-fib that developed during the pulmonary embolisms. She has been on medication and has cut that medication in half as she has not had any recurrent atrial fibrillation that she knows of. She is following up with cardiology as requested.   2. Deep vein thrombosis (DVT) of popliteal vein of right lower extremity, unspecified chronicity (HCC) Patient returns to clinic today 3 month follow up postop pulmonary thrombectomy with a right lower extremity DVT.  Patient underwent vascular venous lower extremity ultrasound for DVT.  There is no evidence to suggest that she has a deep vein thrombosis any longer.  She denies having any difficulties ambulating or sitting.  She denies any pain to her right lower extremity.  She denies any difficulties walking or changes to her lifestyle.  She denies any swelling to her lower extremity today.   Patient will follow-up in 6 months for routine Pulmonary embolism follow up. Discussion related to continuation of  anticoagulation at this time.    Current Outpatient Medications on File Prior to Visit  Medication Sig Dispense Refill   albuterol  (VENTOLIN  HFA) 108 (90 Base) MCG/ACT inhaler Inhale into the lungs.     ALPRAZolam  (XANAX ) 0.25 MG tablet alprazolam  0.25 mg tablet     apixaban  (ELIQUIS ) 5 MG TABS tablet Take 1 tablet (5 mg total) by  mouth 2 (two) times daily. 60 tablet 6   APIXABAN  (ELIQUIS ) VTE STARTER PACK (10MG  AND 5MG ) Take as directed on package: start with two-5mg  tablets twice daily for 7 days. On day 8, switch to one-5mg  tablet twice daily. (Patient not taking: Reported on 12/01/2023) 74 each 0   atorvastatin  (LIPITOR) 10 MG tablet Take 1 tablet by mouth daily.     Barberry-Oreg Grape-Goldenseal (BERBERINE COMPLEX PO) Take 1,200 mg by mouth daily.     FARXIGA  10 MG TABS tablet Take 1 tablet by mouth daily.     GE100 BLOOD GLUCOSE TEST test strip USE TO TEST THREE TIMES DAILY     glipiZIDE  (GLIPIZIDE  XL) 10 MG 24 hr tablet Take 2 tablets (20 mg total) by mouth daily with breakfast. 60 tablet 5   losartan  (COZAAR ) 25 MG tablet Take 1 tablet by mouth daily.     metoprolol  tartrate (LOPRESSOR ) 25 MG tablet Take 25 mg by mouth once.     metoprolol  tartrate (LOPRESSOR ) 50 MG tablet Take 1 tablet (50 mg total) by mouth daily. 30 tablet 6   omeprazole  (PRILOSEC) 40 MG capsule TAKE 1 CAPSULE BY MOUTH 2 TIMES DAILY BEFORE A MEAL (Patient taking differently: 20 mg daily.) 60 capsule 5   ondansetron  (ZOFRAN ) 4 MG tablet Take 1 tablet (4 mg total) by mouth every 8 (eight) hours as needed. 20 tablet 0   Semaglutide,0.25 or 0.5MG /DOS, (OZEMPIC, 0.25 OR 0.5 MG/DOSE,) 2 MG/1.5ML SOPN Inject into the skin once a week.     No current facility-administered medications on file prior to visit.    There are no Patient Instructions on file for this visit. No follow-ups on file.   Gwendlyn JONELLE Shank, NP

## 2024-06-14 ENCOUNTER — Other Ambulatory Visit: Payer: Self-pay

## 2024-06-14 ENCOUNTER — Emergency Department
Admission: EM | Admit: 2024-06-14 | Discharge: 2024-06-14 | Disposition: A | Discharge status: Home / Self Care | Attending: Emergency Medicine | Admitting: Emergency Medicine

## 2024-06-14 ENCOUNTER — Emergency Department

## 2024-06-14 DIAGNOSIS — R0789 Other chest pain: Secondary | ICD-10-CM | POA: Insufficient documentation

## 2024-06-14 DIAGNOSIS — Z7901 Long term (current) use of anticoagulants: Secondary | ICD-10-CM | POA: Insufficient documentation

## 2024-06-14 DIAGNOSIS — R0602 Shortness of breath: Secondary | ICD-10-CM | POA: Diagnosis not present

## 2024-06-14 DIAGNOSIS — E119 Type 2 diabetes mellitus without complications: Secondary | ICD-10-CM | POA: Insufficient documentation

## 2024-06-14 DIAGNOSIS — R079 Chest pain, unspecified: Secondary | ICD-10-CM | POA: Diagnosis present

## 2024-06-14 LAB — BASIC METABOLIC PANEL WITH GFR
Anion gap: 13 (ref 5–15)
BUN: 22 mg/dL — ABNORMAL HIGH (ref 6–20)
CO2: 24 mmol/L (ref 22–32)
Calcium: 9.7 mg/dL (ref 8.9–10.3)
Chloride: 98 mmol/L (ref 98–111)
Creatinine, Ser: 0.64 mg/dL (ref 0.44–1.00)
GFR, Estimated: >60 mL/min
Glucose, Bld: 212 mg/dL — ABNORMAL HIGH (ref 70–99)
Potassium: 4.8 mmol/L (ref 3.5–5.1)
Sodium: 136 mmol/L (ref 135–145)

## 2024-06-14 LAB — CBC
HCT: 40.2 % (ref 36.0–46.0)
Hemoglobin: 13.1 g/dL (ref 12.0–15.0)
MCH: 26.6 pg (ref 26.0–34.0)
MCHC: 32.6 g/dL (ref 30.0–36.0)
MCV: 81.5 fL (ref 80.0–100.0)
Platelets: 239 K/uL (ref 150–400)
RBC: 4.93 MIL/uL (ref 3.87–5.11)
RDW: 13 % (ref 11.5–15.5)
WBC: 7.9 K/uL (ref 4.0–10.5)
nRBC: 0 % (ref 0.0–0.2)

## 2024-06-14 LAB — CBG MONITORING, ED: Glucose-Capillary: 203 mg/dL — ABNORMAL HIGH (ref 70–99)

## 2024-06-14 LAB — TROPONIN T, HIGH SENSITIVITY
Troponin T High Sensitivity: <15 ng/L (ref 0–19)
Troponin T High Sensitivity: <15 ng/L (ref 0–19)

## 2024-06-14 MED ORDER — IOHEXOL 350 MG/ML SOLN
100.0000 mL | Freq: Once | INTRAVENOUS | Status: AC | PRN
  Administered 2024-06-14: 100 mL via INTRAVENOUS

## 2024-06-14 NOTE — ED Triage Notes (Signed)
 Patient reports chest pain, SOB, and blood sugar issues beginning Wednesday

## 2024-06-14 NOTE — ED Provider Notes (Signed)
 "  [facility] Provider Note    Event Date/Time   First MD Initiated Contact with Patient 06/14/24 1504     (approximate)   History   Chest Pain   HPI  Jessica Tran is a 58 y.o. female history of anxiety, bilateral PEs, obesity, diabetes presents emergency department with concerns of chest pain, shortness of breath, elevated blood glucose that started all on Wednesday.  Patient states she had bilateral PEs in April.  Is on Eliquis  at this time.  Had to have the clots removed by Dr. Marea.  Is concerned as she has had a lot of shortness of breath with walking.  No swelling or pain in her legs.  Is supposed to be on a medicine from her endocrinologist for diabetes but could not afford it.  Currently she is taking metformin  and glipizide  only.  Does have an appointment with her endocrinologist to be seen on Monday.      Physical Exam   Triage Vital Signs: ED Triage Vitals  Encounter Vitals Group     BP 06/14/24 1112 (!) 140/58     Girls Systolic BP Percentile --      Girls Diastolic BP Percentile --      Boys Systolic BP Percentile --      Boys Diastolic BP Percentile --      Pulse Rate 06/14/24 1112 63     Resp 06/14/24 1112 19     Temp 06/14/24 1112 98.1 F (36.7 C)     Temp src --      SpO2 06/14/24 1112 98 %     Weight 06/14/24 1111 264 lb 8.8 oz (120 kg)     Height 06/14/24 1111 5' 7 (1.702 m)     Head Circumference --      Peak Flow --      Pain Score 06/14/24 1110 2     Pain Loc --      Pain Education --      Exclude from Growth Chart --     Most recent vital signs: Vitals:   06/14/24 1400 06/14/24 1659  BP: 130/66 128/60  Pulse: 63 64  Resp: 16 18  Temp: 98.4 F (36.9 C)   SpO2: 100% 100%     General: Awake, no distress.   CV:  Good peripheral perfusion. regular rate and  rhythm Resp:  Normal effort. Lungs CTA Abd:  No distention.  Nontender Other:      ED Results / Procedures / Treatments   Labs (all labs ordered are  listed, but only abnormal results are displayed) Labs Reviewed  BASIC METABOLIC PANEL WITH GFR - Abnormal; Notable for the following components:      Result Value   Glucose, Bld 212 (*)    BUN 22 (*)    All other components within normal limits  CBG MONITORING, ED - Abnormal; Notable for the following components:   Glucose-Capillary 203 (*)    All other components within normal limits  CBC  TROPONIN T, HIGH SENSITIVITY  TROPONIN T, HIGH SENSITIVITY     EKG  EKG   RADIOLOGY Chest x-ray, CTA for PE    PROCEDURES:   Procedures  Critical Care: No Chief Complaint  Patient presents with   Chest Pain      MEDICATIONS ORDERED IN ED: Medications  iohexol  (OMNIPAQUE ) 350 MG/ML injection 100 mL (100 mLs Intravenous Contrast Given 06/14/24 1541)     IMPRESSION / MDM / ASSESSMENT AND PLAN / ED  COURSE  I reviewed the triage vital signs and the nursing notes.                              Differential diagnosis includes, but is not limited to, elevated blood glucose secondary to noncompliance, hyperglycemia, hypoglycemia, PE, MI, NSTEMI, angina, anxiety, CAP, CHF  Patient's presentation is most consistent with acute illness / injury with system symptoms.   EKG shows normal sinus rhythm, consider this being normal, see physician read  Labs are reassuring, patient's glucose is 212, anion gap is normal, so do not feel that she is in DKA blood cell counts reassuring  Chest x-ray, independent review interpretation by me as being negative for any acute abnormality  CTA for PE  CTA for PE, independent review and interpretation by me as being negative for any acute abnormality  I did discuss all findings with patient.  Explained to her she should be taking her diabetes medication prior to eating as her glucose is increasing, she has a high level and then she takes her medication.  Explained to her that it is backwards.  She does take her medication so it can start to work when  she eats and her glucose will not rise to that level.  Also she needs to discuss the other medications she is unable to afford with her physician.  Patient's in agreement treatment plan.  Discharged stable condition.      FINAL CLINICAL IMPRESSION(S) / ED DIAGNOSES   Final diagnoses:  Chest wall pain     Rx / DC Orders   ED Discharge Orders     None        Note:  This document was prepared using Dragon voice recognition software and may include unintentional dictation errors.    Gasper Devere ORN, PA-C 06/14/24 Jessica Tran    Dicky Anes, MD 06/24/24 2049  "
